# Patient Record
Sex: Male | Born: 1970 | Race: White | Hispanic: No | Smoking: Never smoker
Health system: Southern US, Community
[De-identification: ages and names within clinical notes are randomized; demographics above are authoritative.]

## PROBLEM LIST (undated history)

## (undated) DIAGNOSIS — I10 Essential (primary) hypertension: Secondary | ICD-10-CM

## (undated) DIAGNOSIS — D593 Hemolytic-uremic syndrome, unspecified: Secondary | ICD-10-CM

## (undated) DIAGNOSIS — M3119 Other thrombotic microangiopathy: Secondary | ICD-10-CM

## (undated) DIAGNOSIS — J309 Allergic rhinitis, unspecified: Secondary | ICD-10-CM

## (undated) DIAGNOSIS — L409 Psoriasis, unspecified: Secondary | ICD-10-CM

## (undated) DIAGNOSIS — M311 Thrombotic microangiopathy: Secondary | ICD-10-CM

## (undated) DIAGNOSIS — K5792 Diverticulitis of intestine, part unspecified, without perforation or abscess without bleeding: Secondary | ICD-10-CM

## (undated) DIAGNOSIS — R945 Abnormal results of liver function studies: Secondary | ICD-10-CM

## (undated) DIAGNOSIS — K635 Polyp of colon: Secondary | ICD-10-CM

## (undated) DIAGNOSIS — E669 Obesity, unspecified: Secondary | ICD-10-CM

## (undated) DIAGNOSIS — D126 Benign neoplasm of colon, unspecified: Secondary | ICD-10-CM

## (undated) DIAGNOSIS — R7989 Other specified abnormal findings of blood chemistry: Secondary | ICD-10-CM

## (undated) HISTORY — DX: Obesity, unspecified: E66.9

## (undated) HISTORY — PX: COLONOSCOPY: SHX174

## (undated) HISTORY — DX: Polyp of colon: K63.5

## (undated) HISTORY — PX: SIGMOIDOSCOPY: SUR1295

## (undated) HISTORY — DX: Allergic rhinitis, unspecified: J30.9

## (undated) HISTORY — DX: Benign neoplasm of colon, unspecified: D12.6

## (undated) HISTORY — DX: Hemolytic-uremic syndrome: D59.3

## (undated) HISTORY — DX: Essential (primary) hypertension: I10

## (undated) HISTORY — PX: WISDOM TOOTH EXTRACTION: SHX21

## (undated) HISTORY — DX: Other specified abnormal findings of blood chemistry: R79.89

## (undated) HISTORY — DX: Psoriasis, unspecified: L40.9

## (undated) HISTORY — DX: Hemolytic-uremic syndrome, unspecified: D59.30

## (undated) HISTORY — DX: Abnormal results of liver function studies: R94.5

## (undated) HISTORY — DX: Diverticulitis of intestine, part unspecified, without perforation or abscess without bleeding: K57.92

---

## 2007-01-30 ENCOUNTER — Ambulatory Visit: Payer: Self-pay | Admitting: Internal Medicine

## 2007-05-31 ENCOUNTER — Other Ambulatory Visit: Payer: Self-pay

## 2007-05-31 ENCOUNTER — Emergency Department: Payer: Self-pay | Admitting: Emergency Medicine

## 2007-08-31 ENCOUNTER — Ambulatory Visit: Payer: Self-pay | Admitting: Family Medicine

## 2009-06-05 ENCOUNTER — Ambulatory Visit: Payer: Self-pay | Admitting: Internal Medicine

## 2009-08-14 ENCOUNTER — Emergency Department: Payer: Self-pay | Admitting: Emergency Medicine

## 2010-03-20 ENCOUNTER — Emergency Department: Payer: Self-pay | Admitting: Emergency Medicine

## 2010-04-12 ENCOUNTER — Ambulatory Visit: Payer: Self-pay | Admitting: Family Medicine

## 2010-04-12 IMAGING — CT CT ABDOMEN AND PELVIS WITHOUT AND WITH CONTRAST
2 of 4 series · 13 of 32 positions shown, 18 images · non-contrast
Comparison: none

REASON FOR EXAM: lower abd pain blood in stool and urine
COMMENTS:

[Series 2: abd wo 5.0 i40f · axial · 0.91mm/px · z∈[-1168,-778]mm · 8 of 102 slices shown, 13 images]
[im 12/102  soft-tissue]
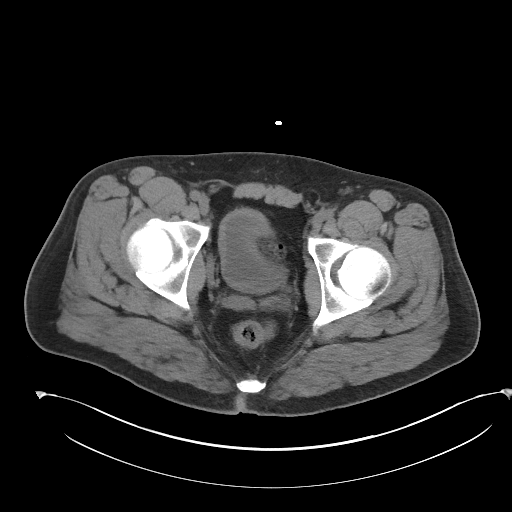
[im 12/102  bone]
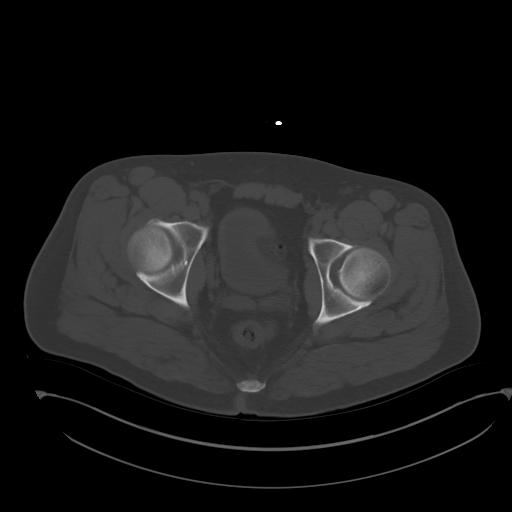
[im 23/102  soft-tissue]
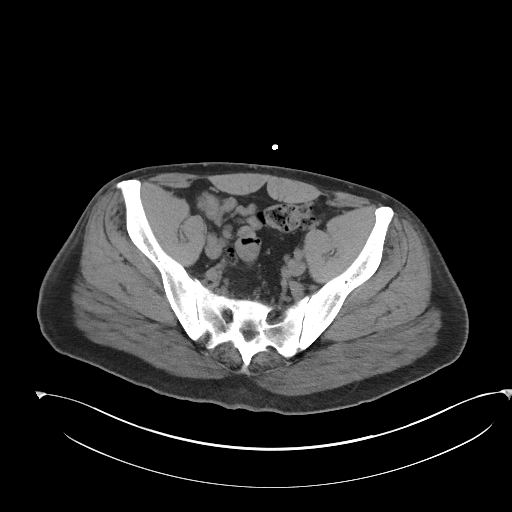
[im 34/102  soft-tissue]
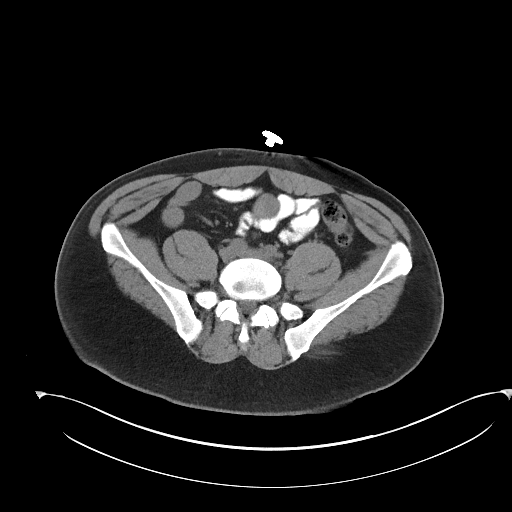
[im 45/102  soft-tissue]
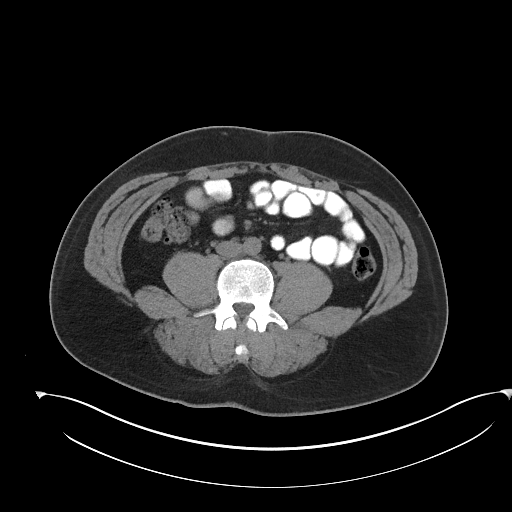
[im 57/102  soft-tissue]
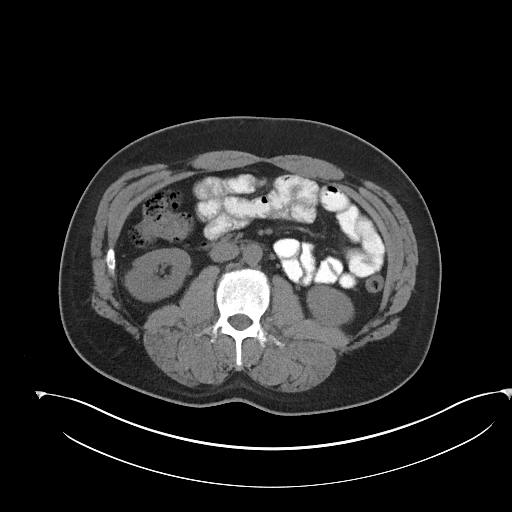
[im 57/102  lung]
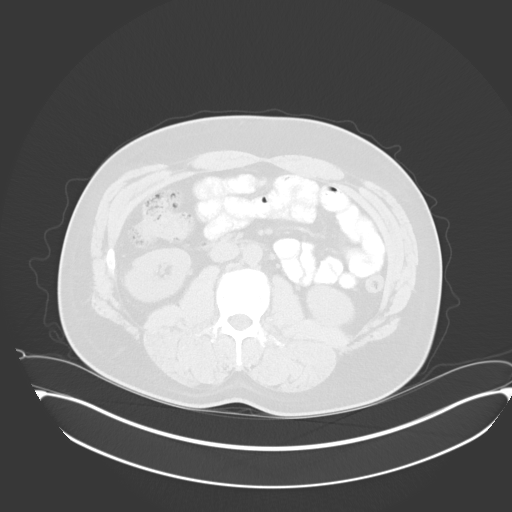
[im 68/102  soft-tissue]
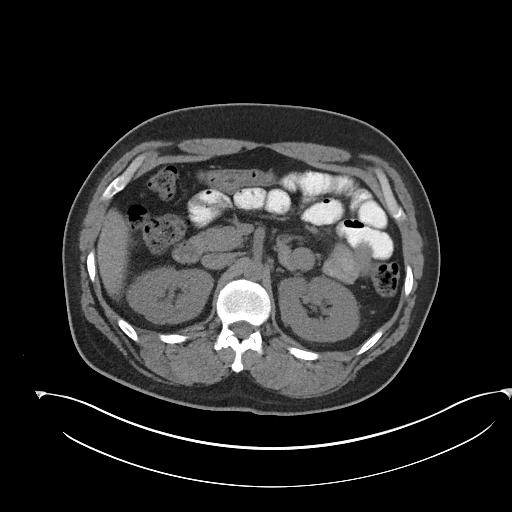
[im 68/102  lung]
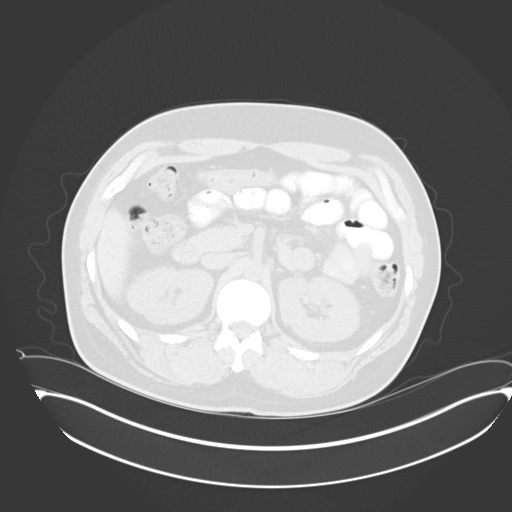
[im 79/102  soft-tissue]
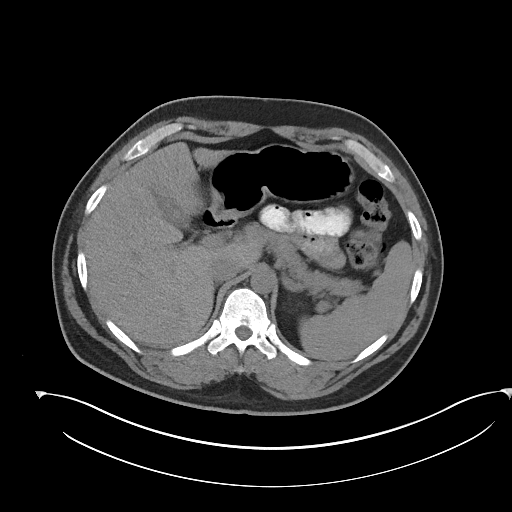
[im 79/102  lung]
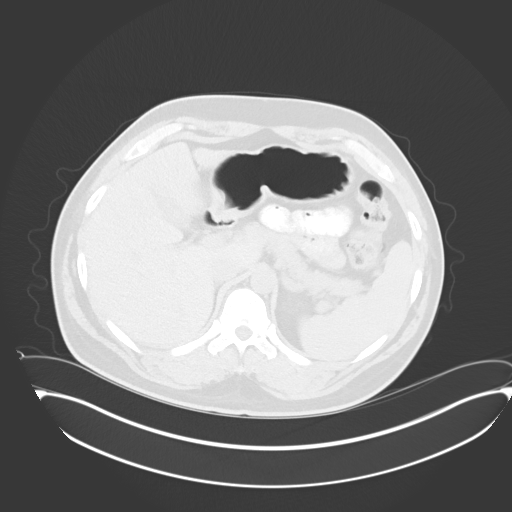
[im 90/102  soft-tissue]
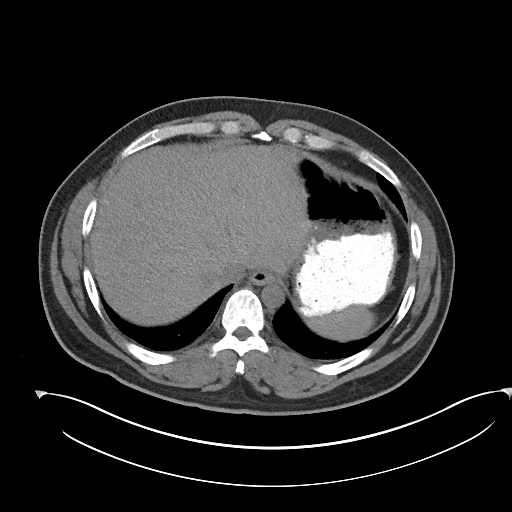
[im 90/102  lung]
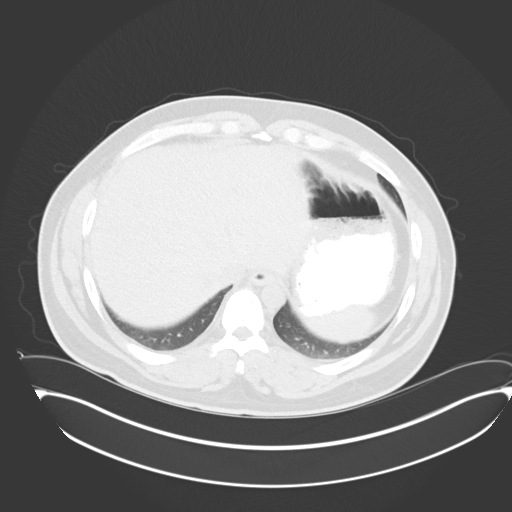

[Series 4: abd with 5.0 i40f · axial · 0.91mm/px · z∈[-1168,-943]mm · 5 of 102 slices shown]
[im 12/102  soft-tissue]
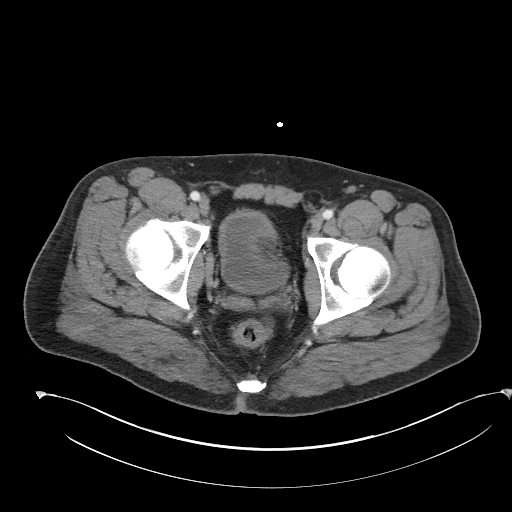
[im 23/102  soft-tissue]
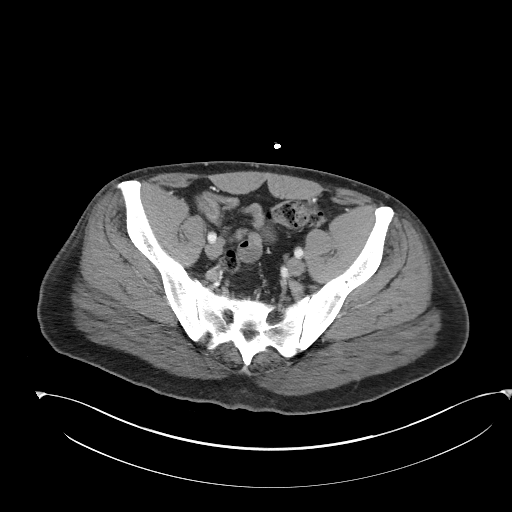
[im 34/102  soft-tissue]
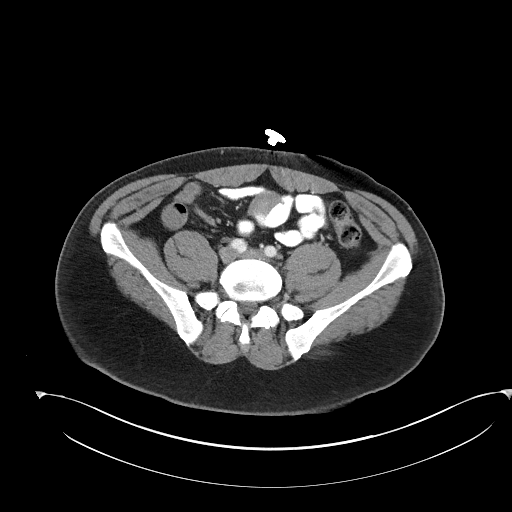
[im 45/102  soft-tissue]
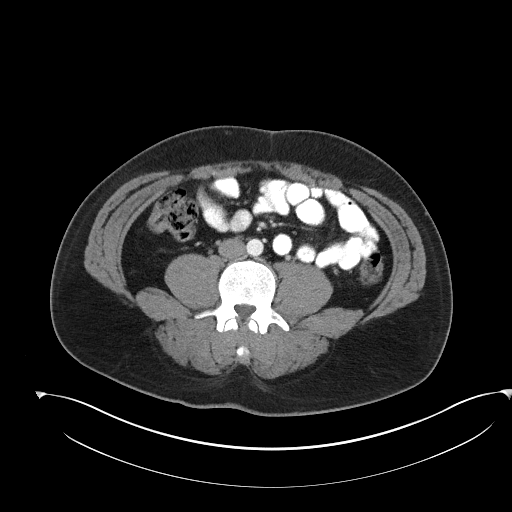
[im 57/102  soft-tissue]
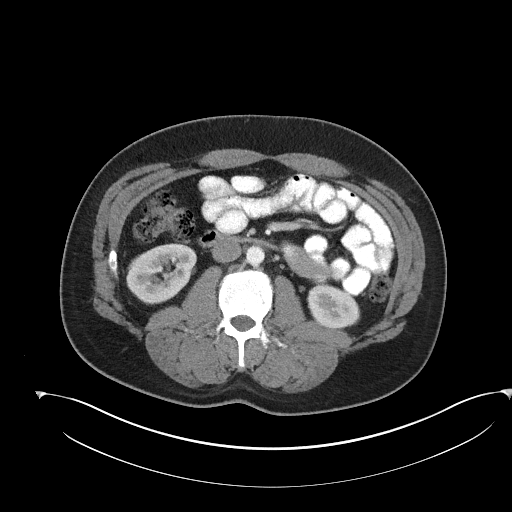

[13 of 32 positions shown; findings below may reference images not displayed]

PROCEDURE:     KCT - KCT ABDOMEN/PELVIS W/WO  - [DATE]  [DATE]

RESULT:     Axial CT scanning was performed through the abdomen and pelvis
at 5 mm intervals and slice thicknesses using a triphasic technique. The
patient received oral contrast. He received IV contrast in the form of 100
mL of [RR] for the postcontrast studies. Review of multiplanar
reconstructed images was performed separately on the VIA monitor.

The liver exhibits normal density with no focal mass nor ductal dilation.
The gallbladder is adequately distended with no evidence of calcified
stones. The spleen is normal in density and contour it does not appear
abnormally enlarged. It measures 11.3 cm longitudinally by 10.6 cm
transversely by approximately 11.9 cm AP. The pancreas, nondistended
correction partially distended stomach, adrenal glands, and kidneys are
normal in appearance. The caliber of the abdominal aorta is normal. The
partially contrast-filled loops of small and large bowel also appear normal.
Within the pelvis the partially distended urinary bladder is normal in
appearance. The prostate gland is mildly enlarged. There is no free fluid in
the pelvis.

On the noncontrast images the kidneys exhibit no calcified stones or
obstruction. The perinephric fat is normal in density. On delayed images
contrast within the renal collecting systems and ureters is within the
limits of normal. The lung bases are clear. The lumbar vertebral bodies are
preserved in height.
IMPRESSION: 1. I do not see evidence of an intra-abdominal or pelvic mass or hematoma.
2. The spleen does not appear abnormally enlarged.
3. There is no intra-abdominal or pelvic lymphadenopathy.
4. I do not see evidence of acute urinary tract abnormality. There is mild
prominence of the prostate gland.
5. I do not see evidence of acute hepatobiliary abnormality nor acute bowel
abnormality. Of note is the fact that the contrast has not yet reached the
colon. A normal appearing appendix is demonstrated.

## 2010-04-14 ENCOUNTER — Ambulatory Visit: Payer: Self-pay | Admitting: Internal Medicine

## 2010-04-18 ENCOUNTER — Ambulatory Visit: Payer: Self-pay | Admitting: Internal Medicine

## 2010-05-09 ENCOUNTER — Ambulatory Visit: Payer: Self-pay | Admitting: Gastroenterology

## 2010-05-10 LAB — PATHOLOGY REPORT

## 2010-05-15 ENCOUNTER — Ambulatory Visit: Payer: Self-pay | Admitting: Internal Medicine

## 2010-06-20 ENCOUNTER — Ambulatory Visit: Payer: Self-pay | Admitting: Internal Medicine

## 2010-07-14 ENCOUNTER — Ambulatory Visit: Payer: Self-pay | Admitting: Internal Medicine

## 2011-10-01 ENCOUNTER — Emergency Department: Payer: Self-pay | Admitting: *Deleted

## 2011-10-01 LAB — CBC
HCT: 44.2 % (ref 40.0–52.0)
MCHC: 35 g/dL (ref 32.0–36.0)
MCV: 95 fL (ref 80–100)
RBC: 4.64 10*6/uL (ref 4.40–5.90)
WBC: 10.9 10*3/uL — ABNORMAL HIGH (ref 3.8–10.6)

## 2011-10-01 LAB — COMPREHENSIVE METABOLIC PANEL
Albumin: 4.1 g/dL (ref 3.4–5.0)
Alkaline Phosphatase: 81 U/L (ref 50–136)
Anion Gap: 10 (ref 7–16)
BUN: 12 mg/dL (ref 7–18)
Bilirubin,Total: 0.6 mg/dL (ref 0.2–1.0)
Calcium, Total: 9.4 mg/dL (ref 8.5–10.1)
Chloride: 106 mmol/L (ref 98–107)
Co2: 24 mmol/L (ref 21–32)
EGFR (African American): >60
EGFR (Non-African Amer.): >60
Glucose: 99 mg/dL (ref 65–99)
Osmolality: 279 (ref 275–301)
Potassium: 3.9 mmol/L (ref 3.5–5.1)
SGPT (ALT): 33 U/L

## 2014-05-24 ENCOUNTER — Ambulatory Visit: Payer: Self-pay | Admitting: Gastroenterology

## 2014-05-24 LAB — HM COLONOSCOPY

## 2014-08-07 LAB — SURGICAL PATHOLOGY

## 2014-09-14 DIAGNOSIS — R7989 Other specified abnormal findings of blood chemistry: Secondary | ICD-10-CM | POA: Insufficient documentation

## 2014-09-14 DIAGNOSIS — R945 Abnormal results of liver function studies: Secondary | ICD-10-CM | POA: Insufficient documentation

## 2015-06-01 ENCOUNTER — Encounter: Payer: Self-pay | Admitting: Emergency Medicine

## 2015-06-01 ENCOUNTER — Emergency Department
Admission: EM | Admit: 2015-06-01 | Discharge: 2015-06-01 | Disposition: A | Discharge status: Home / Self Care | Payer: Worker's Compensation | Attending: Emergency Medicine | Admitting: Emergency Medicine

## 2015-06-01 DIAGNOSIS — Y9289 Other specified places as the place of occurrence of the external cause: Secondary | ICD-10-CM | POA: Diagnosis not present

## 2015-06-01 DIAGNOSIS — Y990 Civilian activity done for income or pay: Secondary | ICD-10-CM | POA: Insufficient documentation

## 2015-06-01 DIAGNOSIS — W228XXA Striking against or struck by other objects, initial encounter: Secondary | ICD-10-CM | POA: Insufficient documentation

## 2015-06-01 DIAGNOSIS — S0101XA Laceration without foreign body of scalp, initial encounter: Secondary | ICD-10-CM | POA: Insufficient documentation

## 2015-06-01 DIAGNOSIS — Z23 Encounter for immunization: Secondary | ICD-10-CM | POA: Diagnosis not present

## 2015-06-01 DIAGNOSIS — Y9389 Activity, other specified: Secondary | ICD-10-CM | POA: Insufficient documentation

## 2015-06-01 HISTORY — DX: Other thrombotic microangiopathy: M31.19

## 2015-06-01 HISTORY — DX: Thrombotic microangiopathy: M31.1

## 2015-06-01 MED ORDER — TETANUS-DIPHTH-ACELL PERTUSSIS 5-2.5-18.5 LF-MCG/0.5 IM SUSP
0.5000 mL | Freq: Once | INTRAMUSCULAR | Status: AC
  Administered 2015-06-01: 0.5 mL via INTRAMUSCULAR
  Filled 2015-06-01: qty 0.5

## 2015-06-01 NOTE — ED Notes (Signed)
Patient's employer [employer] [phone]) has requested a urine drug screen.

## 2015-06-01 NOTE — Discharge Instructions (Signed)
Keep the wound clean and dry for the next 24 hours, then you may shower. Do not put any type of antibiotic ointment or lotions on the laceration until the glue has worn off. He may then use triple antibiotic ointment.  Laceration Care, Adult A laceration is a cut that goes through all layers of the skin. The cut also goes into the tissue that is right under the skin. Some cuts heal on their own. Others need to be closed with stitches (sutures), staples, skin adhesive strips, or wound glue. Taking care of your cut lowers your risk of infection and helps your cut to heal better. HOW TO TAKE CARE OF YOUR CUT For stitches or staples:  Keep the wound clean and dry.  If you were given a bandage (dressing), you should change it at least one time per day or as told by your doctor. You should also change it if it gets wet or dirty.  Keep the wound completely dry for the first 24 hours or as told by your doctor. After that time, you may take a shower or a bath. However, make sure that the wound is not soaked in water until after the stitches or staples have been removed.  Clean the wound one time each day or as told by your doctor:  Wash the wound with soap and water.  Rinse the wound with water until all of the soap comes off.  Pat the wound dry with a clean towel. Do not rub the wound.  After you clean the wound, put a thin layer of antibiotic ointment on it as told by your doctor. This ointment:  Helps to prevent infection.  Keeps the bandage from sticking to the wound.  Have your stitches or staples removed as told by your doctor. If your doctor used skin adhesive strips:   Keep the wound clean and dry.  If you were given a bandage, you should change it at least one time per day or as told by your doctor. You should also change it if it gets dirty or wet.  Do not get the skin adhesive strips wet. You can take a shower or a bath, but be careful to keep the wound dry.  If the wound gets  wet, pat it dry with a clean towel. Do not rub the wound.  Skin adhesive strips fall off on their own. You can trim the strips as the wound heals. Do not remove any strips that are still stuck to the wound. They will fall off after a while. If your doctor used wound glue:  Try to keep your wound dry, but you may briefly wet it in the shower or bath. Do not soak the wound in water, such as by swimming.  After you take a shower or a bath, gently pat the wound dry with a clean towel. Do not rub the wound.  Do not do any activities that will make you really sweaty until the skin glue has fallen off on its own.  Do not apply liquid, cream, or ointment medicine to your wound while the skin glue is still on.  If you were given a bandage, you should change it at least one time per day or as told by your doctor. You should also change it if it gets dirty or wet.  If a bandage is placed over the wound, do not let the tape for the bandage touch the skin glue.  Do not pick at the glue. The skin  glue usually stays on for 5-10 days. Then, it falls off of the skin. General Instructions  To help prevent scarring, make sure to cover your wound with sunscreen whenever you are outside after stitches are removed, after adhesive strips are removed, or when wound glue stays in place and the wound is healed. Make sure to wear a sunscreen of at least 30 SPF.  Take over-the-counter and prescription medicines only as told by your doctor.  If you were given antibiotic medicine or ointment, take or apply it as told by your doctor. Do not stop using the antibiotic even if your wound is getting better.  Do not scratch or pick at the wound.  Keep all follow-up visits as told by your doctor. This is important.  Check your wound every day for signs of infection. Watch for:  Redness, swelling, or pain.  Fluid, blood, or pus.  Raise (elevate) the injured area above the level of your heart while you are sitting or  lying down, if possible. GET HELP IF:  You got a tetanus shot and you have any of these problems at the injection site:  Swelling.  Very bad pain.  Redness.  Bleeding.  You have a fever.  A wound that was closed breaks open.  You notice a bad smell coming from your wound or your bandage.  You notice something coming out of the wound, such as wood or glass.  Medicine does not help your pain.  You have more redness, swelling, or pain at the site of your wound.  You have fluid, blood, or pus coming from your wound.  You notice a change in the color of your skin near your wound.  You need to change the bandage often because fluid, blood, or pus is coming from the wound.  You start to have a new rash.  You start to have numbness around the wound. GET HELP RIGHT AWAY IF:  You have very bad swelling around the wound.  Your pain suddenly gets worse and is very bad.  You notice painful lumps near the wound or on skin that is anywhere on your body.  You have a red streak going away from your wound.  The wound is on your hand or foot and you cannot move a finger or toe like you usually can.  The wound is on your hand or foot and you notice that your fingers or toes look pale or bluish.   This information is not intended to replace advice given to you by your health care provider. Make sure you discuss any questions you have with your health care provider.   Document Released: 09/17/2007 Document Revised: 08/15/2014 Document Reviewed: 03/27/2014 Elsevier Interactive Patient Education [employer].

## 2015-06-01 NOTE — ED Provider Notes (Signed)
[facility] Emergency Department Provider Note ____________________________________________  Time seen: Approximately 11:12 PM  I have reviewed the triage vital signs and the nursing notes.   HISTORY  Chief Complaint Head Laceration   HPI Ricky Mejia is a 45 y.o. male who presents to the emergency department for evaluation of the laceration. He states that while at work, he went to stand up and struck his head on a metal shelf that caused the laceration. He denies loss of consciousness. He denies falling to the ground.   Past Medical History  Diagnosis Date  . T.T.P. syndrome ([place])     There are no active problems to display for this patient.   History reviewed. No pertinent past surgical history.  No current outpatient prescriptions on file.  Allergies Sulfa antibiotics  History reviewed. No pertinent family history.  Social History Social History  Substance Use Topics  . Smoking status: Never Smoker   . Smokeless tobacco: Current User  . Alcohol Use: No    Review of Systems   Constitutional: No recent illness Eyes: No visual changes. ENT: No epistaxis Respiratory: Denies shortness of breath. Musculoskeletal: No extremity injury. Skin: Positive for laceration to the right scalp Neurological: Negative for headaches, focal weakness or numbness.   ____________________________________________   PHYSICAL EXAM:  VITAL SIGNS: ED Triage Vitals  Enc Vitals Group     BP 06/01/15 2120 146/82 mmHg     Pulse Rate 06/01/15 2120 90     Resp 06/01/15 2120 14     Temp 06/01/15 2120 98.8 F (37.1 C)     Temp src --      SpO2 06/01/15 2120 100 %     Weight 06/01/15 2120 240 lb (108.863 kg)     Height 06/01/15 2120 6' 1"  (1.854 m)     Head Cir --      Peak Flow --      Pain Score 06/01/15 2120 2     Pain Loc --      Pain Edu? --      Excl. in [place]? --     Constitutional: Alert and oriented. Well appearing and in no acute  distress. Eyes: Conjunctivae are normal. PERRL. EOMI. Head: Laceration to the right scalp, frontal aspect Mouth/Throat: Mucous membranes are moist.  Oropharynx non-erythematous. No oral lesions or lacerations. Neck: No stridor. Respiratory: Normal respiratory effort.  No retractions. Musculoskeletal: Active range of motion in all extremities. Neurologic:  Normal speech and language. No gross focal neurologic deficits are appreciated. Speech is normal. No gait instability. Skin:  2 cm flap laceration noted to the right side of the scalp, just inside of the hairline;  ____________________________________________   LABS (all labs ordered are listed, but only abnormal results are displayed)  Labs Reviewed - No data to display ____________________________________________  EKG   ____________________________________________  RADIOLOGY  Not indicated ____________________________________________   PROCEDURES  Procedure(s) performed:   Laceration was cleaned with surgical cleanse and irrigated with normal saline. Bleeding was very well controlled. Skin adhesive was used to seal the edges of the wound. Patient tolerated the procedure well and there were no immediate complications. ____________________________________________   INITIAL IMPRESSION / ASSESSMENT AND PLAN / ED COURSE  Pertinent labs & imaging results that were available during my care of the patient were reviewed by me and considered in my medical decision making (see chart for details).  Patient was given wound care instructions and advised to follow-up with primary care provider for any symptoms of  concern. He was advised to return to emergency department for symptoms that change or worsen if he is unable schedule an appointment. ____________________________________________   FINAL CLINICAL IMPRESSION(S) / ED DIAGNOSES  Final diagnoses:  Laceration of scalp without complication, initial encounter       Victorino Dike, FNP 06/01/15 2318  Lavonia Drafts, MD 06/01/15 [phone]

## 2015-06-01 NOTE — ED Notes (Signed)
Pt states lacerated head at work at 2030. Pt with one inch laceration/abrasion with controlled bleeding to right scalp. Pt states struck head on shelf, denies loc.

## 2015-06-01 NOTE — ED Notes (Signed)
Workers comp completed

## 2015-06-04 ENCOUNTER — Emergency Department
Admission: EM | Admit: 2015-06-04 | Discharge: 2015-06-04 | Disposition: A | Discharge status: Home / Self Care | Payer: Worker's Compensation | Attending: Emergency Medicine | Admitting: Emergency Medicine

## 2015-06-04 ENCOUNTER — Encounter: Payer: Self-pay | Admitting: Emergency Medicine

## 2015-06-04 ENCOUNTER — Emergency Department: Payer: Worker's Compensation

## 2015-06-04 DIAGNOSIS — W2209XA Striking against other stationary object, initial encounter: Secondary | ICD-10-CM | POA: Diagnosis not present

## 2015-06-04 DIAGNOSIS — S060X0A Concussion without loss of consciousness, initial encounter: Secondary | ICD-10-CM | POA: Insufficient documentation

## 2015-06-04 DIAGNOSIS — S0990XA Unspecified injury of head, initial encounter: Secondary | ICD-10-CM | POA: Diagnosis present

## 2015-06-04 DIAGNOSIS — Y9389 Activity, other specified: Secondary | ICD-10-CM | POA: Diagnosis not present

## 2015-06-04 DIAGNOSIS — Y998 Other external cause status: Secondary | ICD-10-CM | POA: Insufficient documentation

## 2015-06-04 DIAGNOSIS — Y9289 Other specified places as the place of occurrence of the external cause: Secondary | ICD-10-CM | POA: Insufficient documentation

## 2015-06-04 MED ORDER — ONDANSETRON HCL 4 MG PO TABS: 4.0000 mg | ORAL_TABLET | Freq: Three times a day (TID) | ORAL | Status: DC | PRN

## 2015-06-04 MED ORDER — ONDANSETRON 4 MG PO TBDP
4.0000 mg | ORAL_TABLET | Freq: Once | ORAL | Status: AC
  Administered 2015-06-04: 4 mg via ORAL
  Filled 2015-06-04: qty 1
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
## 2015-06-04 NOTE — Discharge Instructions (Signed)
Concussion, Adult A concussion, or closed-head injury, is a brain injury caused by a direct blow to the head or by a quick and sudden movement (jolt) of the head or neck. Concussions are usually not life-threatening. Even so, the effects of a concussion can be serious. If you have had a concussion before, you are more likely to experience concussion-like symptoms after a direct blow to the head.  CAUSES  Direct blow to the head, such as from running into another player during a soccer game, being hit in a fight, or hitting your head on a hard surface.  A jolt of the head or neck that causes the brain to move back and forth inside the skull, such as in a car crash. SIGNS AND SYMPTOMS The signs of a concussion can be hard to notice. Early on, they may be missed by you, family members, and health care providers. You may look fine but act or feel differently. Symptoms are usually temporary, but they may last for days, weeks, or even longer. Some symptoms may appear right away while others may not show up for hours or days. Every head injury is different. Symptoms include:  Mild to moderate headaches that will not go away.  A feeling of pressure inside your head.  Having more trouble than usual:  Learning or remembering things you have heard.  Answering questions.  Paying attention or concentrating.  Organizing daily tasks.  Making decisions and solving problems.  Slowness in thinking, acting or reacting, speaking, or reading.  Getting lost or being easily confused.  Feeling tired all the time or lacking energy (fatigued).  Feeling drowsy.  Sleep disturbances.  Sleeping more than usual.  Sleeping less than usual.  Trouble falling asleep.  Trouble sleeping (insomnia).  Loss of balance or feeling lightheaded or dizzy.  Nausea or vomiting.  Numbness or tingling.  Increased sensitivity to:  Sounds.  Lights.  Distractions.  Vision problems or eyes that tire  easily.  Diminished sense of taste or smell.  Ringing in the ears.  Mood changes such as feeling sad or anxious.  Becoming easily irritated or angry for little or no reason.  Lack of motivation.  Seeing or hearing things other people do not see or hear (hallucinations). DIAGNOSIS Your health care provider can usually diagnose a concussion based on a description of your injury and symptoms. He or she will ask whether you passed out (lost consciousness) and whether you are having trouble remembering events that happened right before and during your injury. Your evaluation might include:  A brain scan to look for signs of injury to the brain. Even if the test shows no injury, you may still have a concussion.  Blood tests to be sure other problems are not present. TREATMENT  Concussions are usually treated in an emergency department, in urgent care, or at a clinic. You may need to stay in the hospital overnight for further treatment.  Tell your health care provider if you are taking any medicines, including prescription medicines, over-the-counter medicines, and natural remedies. Some medicines, such as blood thinners (anticoagulants) and aspirin, may increase the chance of complications. Also tell your health care provider whether you have had alcohol or are taking illegal drugs. This information may affect treatment.  Your health care provider will send you home with important instructions to follow.  How fast you will recover from a concussion depends on many factors. These factors include how severe your concussion is, what part of your brain was injured,  your age, and how healthy you were before the concussion.  Most people with mild injuries recover fully. Recovery can take time. In general, recovery is slower in older persons. Also, persons who have had a concussion in the past or have other medical problems may find that it takes longer to recover from their current injury. HOME  CARE INSTRUCTIONS General Instructions  Carefully follow the directions your health care provider gave you.  Only take over-the-counter or prescription medicines for pain, discomfort, or fever as directed by your health care provider.  Take only those medicines that your health care provider has approved.  Do not drink alcohol until your health care provider says you are well enough to do so. Alcohol and certain other drugs may slow your recovery and can put you at risk of further injury.  If it is harder than usual to remember things, write them down.  If you are easily distracted, try to do one thing at a time. For example, do not try to watch TV while fixing dinner.  Talk with family members or close friends when making important decisions.  Keep all follow-up appointments. Repeated evaluation of your symptoms is recommended for your recovery.  Watch your symptoms and tell others to do the same. Complications sometimes occur after a concussion. Older adults with a brain injury may have a higher risk of serious complications, such as a blood clot on the brain.  Tell your teachers, school nurse, school counselor, coach, athletic trainer, or work Freight forwarder about your injury, symptoms, and restrictions. Tell them about what you can or cannot do. They should watch for:  Increased problems with attention or concentration.  Increased difficulty remembering or learning new information.  Increased time needed to complete tasks or assignments.  Increased irritability or decreased ability to cope with stress.  Increased symptoms.  Rest. Rest helps the brain to heal. Make sure you:  Get plenty of sleep at night. Avoid staying up late at night.  Keep the same bedtime hours on weekends and weekdays.  Rest during the day. Take daytime naps or rest breaks when you feel tired.  Limit activities that require a lot of thought or concentration. These include:  Doing homework or job-related  work.  Watching TV.  Working on the computer.  Avoid any situation where there is potential for another head injury (football, hockey, soccer, basketball, martial arts, downhill snow sports and horseback riding). Your condition will get worse every time you experience a concussion. You should avoid these activities until you are evaluated by the appropriate follow-up health care providers. Returning To Your Regular Activities You will need to return to your normal activities slowly, not all at once. You must give your body and brain enough time for recovery.  Do not return to sports or other athletic activities until your health care provider tells you it is safe to do so.  Ask your health care provider when you can drive, ride a bicycle, or operate heavy machinery. Your ability to react may be slower after a brain injury. Never do these activities if you are dizzy.  Ask your health care provider about when you can return to work or school. Preventing Another Concussion It is very important to avoid another brain injury, especially before you have recovered. In rare cases, another injury can lead to permanent brain damage, brain swelling, or death. The risk of this is greatest during the first 7-10 days after a head injury. Avoid injuries by:  Wearing a  seat belt when riding in a car.  Drinking alcohol only in moderation.  Wearing a helmet when biking, skiing, skateboarding, skating, or doing similar activities.  Avoiding activities that could lead to a second concussion, such as contact or recreational sports, until your health care provider says it is okay.  Taking safety measures in your home.  Remove clutter and tripping hazards from floors and stairways.  Use grab bars in bathrooms and handrails by stairs.  Place non-slip mats on floors and in bathtubs.  Improve lighting in dim areas. SEEK MEDICAL CARE IF:  You have increased problems paying attention or  concentrating.  You have increased difficulty remembering or learning new information.  You need more time to complete tasks or assignments than before.  You have increased irritability or decreased ability to cope with stress.  You have more symptoms than before. Seek medical care if you have any of the following symptoms for more than 2 weeks after your injury:  Lasting (chronic) headaches.  Dizziness or balance problems.  Nausea.  Vision problems.  Increased sensitivity to noise or light.  Depression or mood swings.  Anxiety or irritability.  Memory problems.  Difficulty concentrating or paying attention.  Sleep problems.  Feeling tired all the time. SEEK IMMEDIATE MEDICAL CARE IF:  You have severe or worsening headaches. These may be a sign of a blood clot in the brain.  You have weakness (even if only in one hand, leg, or part of the face).  You have numbness.  You have decreased coordination.  You vomit repeatedly.  You have increased sleepiness.  One pupil is larger than the other.  You have convulsions.  You have slurred speech.  You have increased confusion. This may be a sign of a blood clot in the brain.  You have increased restlessness, agitation, or irritability.  You are unable to recognize people or places.  You have neck pain.  It is difficult to wake you up.  You have unusual behavior changes.  You lose consciousness. MAKE SURE YOU:  Understand these instructions.  Will watch your condition.  Will get help right away if you are not doing well or get worse.   This information is not intended to replace advice given to you by your health care provider. Make sure you discuss any questions you have with your health care provider.   Document Released: 06/21/2003 Document Revised: 04/21/2014 Document Reviewed: 10/21/2012 Elsevier Interactive Patient Education [employer].  Please return immediately if condition worsens.  Please contact her primary physician or the physician you were given for referral. If you have any specialist physicians involved in her treatment and plan please also contact them. Thank you for using [facility].

## 2015-06-04 NOTE — ED Notes (Signed)
Has been vomiting since head injury Friday night in [facility].Was seen here after and they fixed his head.  Says there was no loc in [facility].

## 2015-12-11 ENCOUNTER — Encounter: Payer: Self-pay | Admitting: Emergency Medicine

## 2015-12-11 ENCOUNTER — Emergency Department
Admission: EM | Admit: 2015-12-11 | Discharge: 2015-12-11 | Disposition: A | Discharge status: Home / Self Care | Payer: 59 | Attending: Emergency Medicine | Admitting: Emergency Medicine

## 2015-12-11 ENCOUNTER — Emergency Department: Payer: 59

## 2015-12-11 DIAGNOSIS — Z79899 Other long term (current) drug therapy: Secondary | ICD-10-CM | POA: Insufficient documentation

## 2015-12-11 DIAGNOSIS — R109 Unspecified abdominal pain: Secondary | ICD-10-CM | POA: Diagnosis present

## 2015-12-11 DIAGNOSIS — R1033 Periumbilical pain: Secondary | ICD-10-CM | POA: Insufficient documentation

## 2015-12-11 DIAGNOSIS — F17200 Nicotine dependence, unspecified, uncomplicated: Secondary | ICD-10-CM | POA: Diagnosis not present

## 2015-12-11 DIAGNOSIS — R112 Nausea with vomiting, unspecified: Secondary | ICD-10-CM | POA: Diagnosis not present

## 2015-12-11 LAB — URINALYSIS COMPLETE WITH MICROSCOPIC (ARMC ONLY)
BACTERIA UA: NONE SEEN
Bilirubin Urine: NEGATIVE
Glucose, UA: NEGATIVE mg/dL
Hgb urine dipstick: NEGATIVE
KETONES UR: NEGATIVE mg/dL
Leukocytes, UA: NEGATIVE
Nitrite: NEGATIVE
PH: 7 (ref 5.0–8.0)
PROTEIN: NEGATIVE mg/dL
SQUAMOUS EPITHELIAL / LPF: NONE SEEN
Specific Gravity, Urine: 1.019 (ref 1.005–1.030)

## 2015-12-11 LAB — CBC
HEMATOCRIT: 44.6 % (ref 40.0–52.0)
Hemoglobin: 16.2 g/dL (ref 13.0–18.0)
MCH: 33.8 pg (ref 26.0–34.0)
MCHC: 36.2 g/dL — ABNORMAL HIGH (ref 32.0–36.0)
MCV: 93.4 fL (ref 80.0–100.0)
PLATELETS: 187 10*3/uL (ref 150–440)
RBC: 4.78 MIL/uL (ref 4.40–5.90)
RDW: 13.1 % (ref 11.5–14.5)
WBC: 8.4 10*3/uL (ref 3.8–10.6)

## 2015-12-11 LAB — COMPREHENSIVE METABOLIC PANEL
ALK PHOS: 67 U/L (ref 38–126)
ALT: 84 U/L — AB (ref 17–63)
AST: 76 U/L — AB (ref 15–41)
Albumin: 4.7 g/dL (ref 3.5–5.0)
Anion gap: 6 (ref 5–15)
BUN: 13 mg/dL (ref 6–20)
CALCIUM: 9.5 mg/dL (ref 8.9–10.3)
CHLORIDE: 104 mmol/L (ref 101–111)
CO2: 26 mmol/L (ref 22–32)
CREATININE: 1.06 mg/dL (ref 0.61–1.24)
GFR calc Af Amer: >60 mL/min (ref >60)
GFR calc non Af Amer: >60 mL/min (ref >60)
GLUCOSE: 102 mg/dL — AB (ref 65–99)
Potassium: 3.9 mmol/L (ref 3.5–5.1)
Sodium: 136 mmol/L (ref 135–145)
Total Bilirubin: 0.9 mg/dL (ref 0.3–1.2)
Total Protein: 7.6 g/dL (ref 6.5–8.1)

## 2015-12-11 LAB — LIPASE, BLOOD: Lipase: 49 U/L (ref 11–51)

## 2015-12-11 LAB — TROPONIN I

## 2015-12-11 MED ORDER — IOPAMIDOL (ISOVUE-300) INJECTION 61%
100.0000 mL | Freq: Once | INTRAVENOUS | Status: AC | PRN
  Administered 2015-12-11: 100 mL via INTRAVENOUS

## 2015-12-11 MED ORDER — MORPHINE SULFATE (PF) 4 MG/ML IV SOLN
4.0000 mg | Freq: Once | INTRAVENOUS | Status: AC
  Administered 2015-12-11: 4 mg via INTRAVENOUS
  Filled 2015-12-11: qty 1

## 2015-12-11 MED ORDER — SODIUM CHLORIDE 0.9 % IV BOLUS (SEPSIS)
1000.0000 mL | Freq: Once | INTRAVENOUS | Status: AC
  Administered 2015-12-11: 1000 mL via INTRAVENOUS

## 2015-12-11 MED ORDER — ONDANSETRON HCL 4 MG/2ML IJ SOLN
4.0000 mg | Freq: Once | INTRAMUSCULAR | Status: AC
  Administered 2015-12-11: 4 mg via INTRAVENOUS
  Filled 2015-12-11: qty 2

## 2015-12-11 MED ORDER — DIATRIZOATE MEGLUMINE & SODIUM 66-10 % PO SOLN
15.0000 mL | Freq: Once | ORAL | Status: AC
  Administered 2015-12-11: 15 mL via ORAL

## 2015-12-11 MED ORDER — ONDANSETRON 4 MG PO TBDP: 4.0000 mg | ORAL_TABLET | Freq: Three times a day (TID) | ORAL | 0 refills | Status: DC | PRN

## 2015-12-11 NOTE — ED Notes (Signed)
AAOx3.  Skin warm and dry.  Ambulates with easy and steady gait.  NAD.  POsture upright and relaxed.

## 2015-12-11 NOTE — ED Provider Notes (Signed)
[facility] Emergency Department Provider Note   ____________________________________________   First MD Initiated Contact with Patient 12/11/15 [phone]     (approximate)  I have reviewed the triage vital signs and the nursing notes.   HISTORY  Chief Complaint Emesis and Abdominal Pain    HPI Ricky Mejia is a 45 y.o. male who presents to the ED from home with a chief complaint of abdominal pain, nausea and vomiting. Reports symptoms onset approximately 11 PM. Describes nonradiating, sharp periumbilical pain associated with several bouts of nausea and vomiting. Last bowel movement at noon which was normal for patient. Denies associated fever, chills, chest pain, shortness of breath, dysuria, testicular pain or swelling. Denies recent travel or trauma. Nothing makes his symptoms better or worse.   Past Medical History:  Diagnosis Date  . T.T.P. syndrome ([place])     There are no active problems to display for this patient.   History reviewed. No pertinent surgical history.  Prior to Admission medications   Medication Sig Start Date End Date Taking? Authorizing Provider  ondansetron (ZOFRAN) 4 MG tablet Take 1 tablet (4 mg total) by mouth every 8 (eight) hours as needed for nausea or vomiting. 06/04/15   Daymon Larsen, MD    Allergies Sulfa antibiotics  No family history on file.  Social History Social History  Substance Use Topics  . Smoking status: Never Smoker  . Smokeless tobacco: Current User  . Alcohol use No    Review of Systems  Constitutional: No fever/chills. Eyes: No visual changes. ENT: No sore throat. Cardiovascular: Denies chest pain. Respiratory: Denies shortness of breath. Gastrointestinal: Positive for abdominal pain, nausea and vomiting.  No diarrhea.  No constipation. Genitourinary: Negative for dysuria. Musculoskeletal: Negative for back pain. Skin: Negative for rash. Neurological: Negative for headaches, focal  weakness or numbness.  10-point ROS otherwise negative.  ____________________________________________   PHYSICAL EXAM:  VITAL SIGNS: ED Triage Vitals  Enc Vitals Group     BP 12/11/15 0304 (!) 148/94     Pulse Rate 12/11/15 0304 80     Resp 12/11/15 0304 18     Temp 12/11/15 0304 98 F (36.7 C)     Temp Source 12/11/15 0304 Oral     SpO2 12/11/15 0304 99 %     Weight 12/11/15 0301 230 lb (104.3 kg)     Height 12/11/15 0301 6' 1"  (1.854 m)     Head Circumference --      Peak Flow --      Pain Score 12/11/15 0300 5     Pain Loc --      Pain Edu? --      Excl. in [place]? --     Constitutional: Asleep, easily awakened for exam. Alert and oriented. Well appearing and in no acute distress. Eyes: Conjunctivae are normal. PERRL. EOMI. Head: Atraumatic. Nose: No congestion/rhinnorhea. Mouth/Throat: Mucous membranes are moist.  Oropharynx non-erythematous. Neck: No stridor.   Cardiovascular: Normal rate, regular rhythm. Grossly normal heart sounds.  Good peripheral circulation. Respiratory: Normal respiratory effort.  No retractions. Lungs CTAB. Gastrointestinal: Soft and mildly tender to palpation at umbilicus without rebound or guarding. No distention. No abdominal bruits. No CVA tenderness. Musculoskeletal: No lower extremity tenderness nor edema.  No joint effusions. Neurologic:  Normal speech and language. No gross focal neurologic deficits are appreciated. No gait instability. Skin:  Skin is warm, dry and intact. No rash noted. Psychiatric: Mood and affect are normal. Speech and behavior are normal.  ____________________________________________   LABS (all labs ordered are listed, but only abnormal results are displayed)  Labs Reviewed  CBC - Abnormal; Notable for the following:       Result Value   MCHC 36.2 (*)    All other components within normal limits  COMPREHENSIVE METABOLIC PANEL - Abnormal; Notable for the following:    Glucose, Bld 102 (*)    AST 76 (*)     ALT 84 (*)    All other components within normal limits  URINALYSIS COMPLETEWITH MICROSCOPIC ([facility] ONLY) - Abnormal; Notable for the following:    Color, Urine YELLOW (*)    APPearance CLEAR (*)    All other components within normal limits  LIPASE, BLOOD  TROPONIN I   ____________________________________________  EKG  ED ECG REPORT I, Stephanieann Popescu J, the attending physician, personally viewed and interpreted this ECG.   Date: 12/11/2015  EKG Time: 0308  Rate: 70  Rhythm: normal EKG, normal sinus rhythm  Axis: Normal  Intervals:none  ST&T Change: Nonspecific  ____________________________________________  RADIOLOGY  Pending ____________________________________________   PROCEDURES  Procedure(s) performed: None  Procedures  Critical Care performed: No  ____________________________________________   INITIAL IMPRESSION / ASSESSMENT AND PLAN / ED COURSE  Pertinent labs & imaging results that were available during my care of the patient were reviewed by me and considered in my medical decision making (see chart for details).  45 year old male with no past surgical history who presents with periumbilical pain associated with nausea and vomiting. Laboratory results are unremarkable. Will infuse IV fluids, administer IV analgesia and proceed with CT abdomen/pelvis to evaluate for intra-abdominal etiology.  Clinical Course  Comment By Time  Care transferred to Dr. Edd Fabian pending results of CT scan. Paulette Blanch, MD 08/29 0730     ____________________________________________   FINAL CLINICAL IMPRESSION(S) / ED DIAGNOSES  Final diagnoses:  Periumbilical abdominal pain  Non-intractable vomiting with nausea, vomiting of unspecified type      NEW MEDICATIONS STARTED DURING THIS VISIT:  New Prescriptions   No medications on file     Note:  This document was prepared using Dragon voice recognition software and may include unintentional dictation errors.    Paulette Blanch, MD 12/11/15 0730

## 2015-12-11 NOTE — ED Provider Notes (Signed)
-----------------------------------------   9:31 AM on 12/11/2015 -----------------------------------------  I assumed care of this patient from Dr. Beather Arbour at 7:30 AM pending results of the CT of the abdomen and pelvis which are negative for any acute intra-abdominal pelvic pathology. Patient reports improvement of his symptoms at this time, tolerating by mouth without vomiting. Discussed return precautions, need for close PCP follow-up and he is comfortable with discharge plan. DC home.  IMPRESSION: 1. No acute findings. 2. Hepatomegaly with extensive hepatic steatosis. 3. Minor degenerative changes noted along the visualized spine. 4. No other abnormalities.   Joanne Gavel, MD 12/11/15 [phone]

## 2015-12-11 NOTE — ED Triage Notes (Signed)
Patient ambulatory to triage with steady gait, without difficulty or distress noted; pt reports N/V and mid abd pain since 11pm

## 2016-07-06 DIAGNOSIS — J069 Acute upper respiratory infection, unspecified: Secondary | ICD-10-CM | POA: Diagnosis not present

## 2016-07-21 DIAGNOSIS — J019 Acute sinusitis, unspecified: Secondary | ICD-10-CM | POA: Diagnosis not present

## 2016-07-21 DIAGNOSIS — H66003 Acute suppurative otitis media without spontaneous rupture of ear drum, bilateral: Secondary | ICD-10-CM | POA: Diagnosis not present

## 2016-07-21 DIAGNOSIS — B9689 Other specified bacterial agents as the cause of diseases classified elsewhere: Secondary | ICD-10-CM | POA: Diagnosis not present

## 2016-08-31 DIAGNOSIS — R1084 Generalized abdominal pain: Secondary | ICD-10-CM | POA: Diagnosis not present

## 2017-05-02 DIAGNOSIS — R21 Rash and other nonspecific skin eruption: Secondary | ICD-10-CM | POA: Diagnosis not present

## 2017-07-22 ENCOUNTER — Encounter: Payer: Self-pay | Admitting: Family Medicine

## 2017-07-22 ENCOUNTER — Ambulatory Visit: Payer: BLUE CROSS/BLUE SHIELD | Admitting: Family Medicine

## 2017-07-22 VITALS — BP 127/85 | HR 76 | Temp 98.4°F | Ht 69.0 in | Wt 259.5 lb

## 2017-07-22 DIAGNOSIS — J309 Allergic rhinitis, unspecified: Secondary | ICD-10-CM

## 2017-07-22 DIAGNOSIS — R21 Rash and other nonspecific skin eruption: Secondary | ICD-10-CM

## 2017-07-22 DIAGNOSIS — Z7689 Persons encountering health services in other specified circumstances: Secondary | ICD-10-CM

## 2017-07-22 MED ORDER — TRIAMCINOLONE ACETONIDE 0.1 % EX CREA: TOPICAL_CREAM | Freq: Two times a day (BID) | CUTANEOUS | 3 refills | Status: DC | PRN

## 2017-07-22 MED ORDER — MONTELUKAST SODIUM 10 MG PO TABS: 10.0000 mg | ORAL_TABLET | Freq: Every day | ORAL | 11 refills | Status: DC

## 2017-07-22 MED ORDER — CLOTRIMAZOLE 1 % EX CREA: 1.0000 "application " | TOPICAL_CREAM | Freq: Two times a day (BID) | CUTANEOUS | 1 refills | Status: DC

## 2017-07-22 MED ORDER — LEVOCETIRIZINE DIHYDROCHLORIDE 5 MG PO TABS: 5.0000 mg | ORAL_TABLET | Freq: Every evening | ORAL | 11 refills | Status: DC

## 2017-07-22 NOTE — Progress Notes (Signed)
BP 127/85 (BP Location: Right Arm, Patient Position: Sitting, Cuff Size: Normal)   Pulse 76   Temp 98.4 F (36.9 C) (Oral)   Ht 5' 9"  (1.753 m)   Wt 259 lb 8 oz (117.7 kg)   SpO2 98%   BMI 38.32 kg/m    Subjective:    Patient ID: Ricky Mejia, male    DOB: 1970/08/28, 47 y.o.   MRN: [id number]  HPI: Ricky Mejia is a 47 y.o. male  Chief Complaint  Patient presents with  . Establish Care  . Rash    Itchy rash ongoing for a while. Patient states he thinks he's allergic to the "D" medication in Zyrtec and Claritin. Very itchy. Both legs.   Pt here today to establish care. Hx of allergies and some GI issues followed by specialist. Last CPE was over a year ago.   Uses triamcinolone several times daily for itchy rash on b/l legs that's been present the past few weeks which helps but it keeps coming back, also taking zyrtec D.Starting to wonder if he's allergic to the decongestant portion of the medication as it seemed to start around the same time. No other new products or exposures known.   Relevant past medical, surgical, family and social history reviewed and updated as indicated. Interim medical history since our last visit reviewed. Allergies and medications reviewed and updated.  Review of Systems  Per HPI unless specifically indicated above     Objective:    BP 127/85 (BP Location: Right Arm, Patient Position: Sitting, Cuff Size: Normal)   Pulse 76   Temp 98.4 F (36.9 C) (Oral)   Ht 5' 9"  (1.753 m)   Wt 259 lb 8 oz (117.7 kg)   SpO2 98%   BMI 38.32 kg/m   Wt Readings from Last 3 Encounters:  07/22/17 259 lb 8 oz (117.7 kg)  12/11/15 230 lb (104.3 kg)  06/04/15 260 lb (117.9 kg)    Physical Exam  Constitutional: He is oriented to person, place, and time. He appears well-developed and well-nourished.  HENT:  Head: Atraumatic.  Oropharynx and nasal mucosa injected, rhinorrhea present  Eyes: Pupils are equal, round, and reactive to light. Conjunctivae  are normal.  Neck: Normal range of motion. Neck supple.  Cardiovascular: Normal rate and regular rhythm.  Pulmonary/Chest: Effort normal and breath sounds normal.  Musculoskeletal: Normal range of motion.  Neurological: He is alert and oriented to person, place, and time.  Skin: Skin is warm and dry. Rash (Erythematous maculopapular rash diffusely across b/l LEs. Scabbing in some areas from signifiant scratching) noted.  Psychiatric: He has a normal mood and affect. His behavior is normal.  Nursing note and vitals reviewed.   Results for orders placed or performed in visit on 07/22/17  HM COLONOSCOPY  Result Value Ref Range   HM Colonoscopy See Report (in chart) See Report (in chart), Patient Reported      Assessment & Plan:   Problem List Items Addressed This Visit      Respiratory   Allergic rhinitis - Primary    Start xyzal and singulair for poorly controlled allergy sxs. Monitor closely for benefit. Intolerant to nasal sprays. Humidifier, sinus rinses as tolerated       Other Visit Diagnoses    Encounter to establish care       Rash       Difficult to discern eczema vs hives as he's itched it so much. Continue triamcinolone, moisturize, no scented products. Switch to  xyzal from zyrtec D       Follow up plan: Return for CPE.

## 2017-07-24 DIAGNOSIS — J309 Allergic rhinitis, unspecified: Secondary | ICD-10-CM | POA: Insufficient documentation

## 2017-07-24 NOTE — Assessment & Plan Note (Signed)
Start xyzal and singulair for poorly controlled allergy sxs. Monitor closely for benefit. Intolerant to nasal sprays. Humidifier, sinus rinses as tolerated

## 2017-07-24 NOTE — Patient Instructions (Signed)
Follow up for CPE

## 2017-09-16 ENCOUNTER — Encounter: Payer: BLUE CROSS/BLUE SHIELD | Admitting: Family Medicine

## 2017-11-03 ENCOUNTER — Encounter: Payer: Self-pay | Admitting: Unknown Physician Specialty

## 2017-11-03 ENCOUNTER — Ambulatory Visit (INDEPENDENT_AMBULATORY_CARE_PROVIDER_SITE_OTHER): Payer: BLUE CROSS/BLUE SHIELD | Admitting: Unknown Physician Specialty

## 2017-11-03 VITALS — BP 139/104 | HR 99 | Temp 98.2°F | Ht 69.0 in | Wt 271.0 lb

## 2017-11-03 DIAGNOSIS — L309 Dermatitis, unspecified: Secondary | ICD-10-CM

## 2017-11-03 DIAGNOSIS — R42 Dizziness and giddiness: Secondary | ICD-10-CM

## 2017-11-03 DIAGNOSIS — K625 Hemorrhage of anus and rectum: Secondary | ICD-10-CM

## 2017-11-03 DIAGNOSIS — J309 Allergic rhinitis, unspecified: Secondary | ICD-10-CM | POA: Diagnosis not present

## 2017-11-03 DIAGNOSIS — I10 Essential (primary) hypertension: Secondary | ICD-10-CM

## 2017-11-03 DIAGNOSIS — Z0000 Encounter for general adult medical examination without abnormal findings: Secondary | ICD-10-CM | POA: Diagnosis not present

## 2017-11-03 DIAGNOSIS — Z0001 Encounter for general adult medical examination with abnormal findings: Secondary | ICD-10-CM | POA: Diagnosis not present

## 2017-11-03 MED ORDER — TRIAMCINOLONE ACETONIDE 0.1 % EX CREA: TOPICAL_CREAM | Freq: Two times a day (BID) | CUTANEOUS | 3 refills | Status: DC | PRN

## 2017-11-03 NOTE — Patient Instructions (Signed)
DASH Eating Plan DASH stands for "Dietary Approaches to Stop Hypertension." The DASH eating plan is a healthy eating plan that has been shown to reduce high blood pressure (hypertension). It may also reduce your risk for type 2 diabetes, heart disease, and stroke. The DASH eating plan may also help with weight loss. What are tips for following this plan? General guidelines  Avoid eating more than 2,300 mg (milligrams) of salt (sodium) a day. If you have hypertension, you may need to reduce your sodium intake to 1,500 mg a day.  Limit alcohol intake to no more than 1 drink a day for nonpregnant women and 2 drinks a day for men. One drink equals 12 oz of beer, 5 oz of wine, or 1 oz of hard liquor.  Work with your health care provider to maintain a healthy body weight or to lose weight. Ask what an ideal weight is for you.  Get at least 30 minutes of exercise that causes your heart to beat faster (aerobic exercise) most days of the week. Activities may include walking, swimming, or biking.  Work with your health care provider or diet and nutrition specialist (dietitian) to adjust your eating plan to your individual calorie needs. Reading food labels  Check food labels for the amount of sodium per serving. Choose foods with less than 5 percent of the Daily Value of sodium. Generally, foods with less than 300 mg of sodium per serving fit into this eating plan.  To find whole grains, look for the word "whole" as the first word in the ingredient list. Shopping  Buy products labeled as "low-sodium" or "no salt added."  Buy fresh foods. Avoid canned foods and premade or frozen meals. Cooking  Avoid adding salt when cooking. Use salt-free seasonings or herbs instead of table salt or sea salt. Check with your health care provider or pharmacist before using salt substitutes.  Do not fry foods. Cook foods using healthy methods such as baking, boiling, grilling, and broiling instead.  Cook with  heart-healthy oils, such as olive, canola, soybean, or sunflower oil. Meal planning   Eat a balanced diet that includes: ? 5 or more servings of fruits and vegetables each day. At each meal, try to fill half of your plate with fruits and vegetables. ? Up to 6-8 servings of whole grains each day. ? Less than 6 oz of lean meat, poultry, or fish each day. A 3-oz serving of meat is about the same size as a deck of cards. One egg equals 1 oz. ? 2 servings of low-fat dairy each day. ? A serving of nuts, seeds, or beans 5 times each week. ? Heart-healthy fats. Healthy fats called Omega-3 fatty acids are found in foods such as flaxseeds and coldwater fish, like sardines, salmon, and mackerel.  Limit how much you eat of the following: ? Canned or prepackaged foods. ? Food that is high in trans fat, such as fried foods. ? Food that is high in saturated fat, such as fatty meat. ? Sweets, desserts, sugary drinks, and other foods with added sugar. ? Full-fat dairy products.  Do not salt foods before eating.  Try to eat at least 2 vegetarian meals each week.  Eat more home-cooked food and less restaurant, buffet, and fast food.  When eating at a restaurant, ask that your food be prepared with less salt or no salt, if possible. What foods are recommended? The items listed may not be a complete list. Talk with your dietitian about what   dietary choices are best for you. Grains Whole-grain or whole-wheat bread. Whole-grain or whole-wheat pasta. Brown rice. Oatmeal. Quinoa. Bulgur. Whole-grain and low-sodium cereals. Pita bread. Low-fat, low-sodium crackers. Whole-wheat flour tortillas. Vegetables Fresh or frozen vegetables (raw, steamed, roasted, or grilled). Low-sodium or reduced-sodium tomato and vegetable juice. Low-sodium or reduced-sodium tomato sauce and tomato paste. Low-sodium or reduced-sodium canned vegetables. Fruits All fresh, dried, or frozen fruit. Canned fruit in natural juice (without  added sugar). Meat and other protein foods Skinless chicken or turkey. Ground chicken or turkey. Pork with fat trimmed off. Fish and seafood. Egg whites. Dried beans, peas, or lentils. Unsalted nuts, nut butters, and seeds. Unsalted canned beans. Lean cuts of beef with fat trimmed off. Low-sodium, lean deli meat. Dairy Low-fat (1%) or fat-free (skim) milk. Fat-free, low-fat, or reduced-fat cheeses. Nonfat, low-sodium ricotta or cottage cheese. Low-fat or nonfat yogurt. Low-fat, low-sodium cheese. Fats and oils Soft margarine without trans fats. Vegetable oil. Low-fat, reduced-fat, or light mayonnaise and salad dressings (reduced-sodium). Canola, safflower, olive, soybean, and sunflower oils. Avocado. Seasoning and other foods Herbs. Spices. Seasoning mixes without salt. Unsalted popcorn and pretzels. Fat-free sweets. What foods are not recommended? The items listed may not be a complete list. Talk with your dietitian about what dietary choices are best for you. Grains Baked goods made with fat, such as croissants, muffins, or some breads. Dry pasta or rice meal packs. Vegetables Creamed or fried vegetables. Vegetables in a cheese sauce. Regular canned vegetables (not low-sodium or reduced-sodium). Regular canned tomato sauce and paste (not low-sodium or reduced-sodium). Regular tomato and vegetable juice (not low-sodium or reduced-sodium). Pickles. Olives. Fruits Canned fruit in a light or heavy syrup. Fried fruit. Fruit in cream or butter sauce. Meat and other protein foods Fatty cuts of meat. Ribs. Fried meat. Bacon. Sausage. Bologna and other processed lunch meats. Salami. Fatback. Hotdogs. Bratwurst. Salted nuts and seeds. Canned beans with added salt. Canned or smoked fish. Whole eggs or egg yolks. Chicken or turkey with skin. Dairy Whole or 2% milk, cream, and half-and-half. Whole or full-fat cream cheese. Whole-fat or sweetened yogurt. Full-fat cheese. Nondairy creamers. Whipped toppings.  Processed cheese and cheese spreads. Fats and oils Butter. Stick margarine. Lard. Shortening. Ghee. Bacon fat. Tropical oils, such as coconut, palm kernel, or palm oil. Seasoning and other foods Salted popcorn and pretzels. Onion salt, garlic salt, seasoned salt, table salt, and sea salt. Worcestershire sauce. Tartar sauce. Barbecue sauce. Teriyaki sauce. Soy sauce, including reduced-sodium. Steak sauce. Canned and packaged gravies. Fish sauce. Oyster sauce. Cocktail sauce. Horseradish that you find on the shelf. Ketchup. Mustard. Meat flavorings and tenderizers. Bouillon cubes. Hot sauce and Tabasco sauce. Premade or packaged marinades. Premade or packaged taco seasonings. Relishes. Regular salad dressings. Where to find more information:  National Heart, Lung, and Blood Institute: www.nhlbi.nih.gov  American Heart Association: www.heart.org Summary  The DASH eating plan is a healthy eating plan that has been shown to reduce high blood pressure (hypertension). It may also reduce your risk for type 2 diabetes, heart disease, and stroke.  With the DASH eating plan, you should limit salt (sodium) intake to 2,300 mg a day. If you have hypertension, you may need to reduce your sodium intake to 1,500 mg a day.  When on the DASH eating plan, aim to eat more fresh fruits and vegetables, whole grains, lean proteins, low-fat dairy, and heart-healthy fats.  Work with your health care provider or diet and nutrition specialist (dietitian) to adjust your eating plan to your individual   calorie needs. This information is not intended to replace advice given to you by your health care provider. Make sure you discuss any questions you have with your health care provider. Document Released: 03/20/2011 Document Revised: 03/24/2016 Document Reviewed: 03/24/2016 Elsevier Interactive Patient Education  2018 Elsevier Inc.  

## 2017-11-03 NOTE — Assessment & Plan Note (Addendum)
BP 130/94 on recheck.  Discussed DASH diet.  Recheck in 2 weeks

## 2017-11-03 NOTE — Assessment & Plan Note (Signed)
Refilled steroid cream.  Recheck in one month.

## 2017-11-03 NOTE — Progress Notes (Signed)
BP (!) 139/104   Pulse 99   Temp 98.2 F (36.8 C) (Oral)   Ht 5' 9"  (1.753 m)   Wt 271 lb (122.9 kg)   SpO2 97%   BMI 40.02 kg/m    Subjective:    Patient ID: Ricky Mejia, male    DOB: 05/11/1970, 47 y.o.   MRN: [id number]  HPI: Ricky Mejia is a 47 y.o. male  Chief Complaint  Patient presents with  . Annual Exam   Still having trouble with allergies.  Taking Zyrtec D.  Give Singulair which didn't help.   States he is unable to spray anything in his nose and steroid nasal sprays are not an option for him.    Lightheaded Pt states he is getting light-headed when working a physical job.  Drinks a lot of fluids which is mostly water and Gatorade.  Started taking iron pills which helped.    Depression screen [facility] 2/9 11/03/2017  Decreased Interest 0  Down, Depressed, Hopeless 0  PHQ - 2 Score 0  Altered sleeping 0  Tired, decreased energy 1  Change in appetite 0  Feeling bad or failure about yourself  0  Trouble concentrating 0  Moving slowly or fidgety/restless 0  Suicidal thoughts 0  PHQ-9 Score 1     Social History   Socioeconomic History  . Marital status: Divorced    Spouse name: Not on file  . Number of children: Not on file  . Years of education: Not on file  . Highest education level: Not on file  Occupational History  . Not on file  Social Needs  . Financial resource strain: Not on file  . Food insecurity:    Worry: Not on file    Inability: Not on file  . Transportation needs:    Medical: Not on file    Non-medical: Not on file  Tobacco Use  . Smoking status: Never Smoker  . Smokeless tobacco: Current User  Substance and Sexual Activity  . Alcohol use: No  . Drug use: No  . Sexual activity: Not on file  Lifestyle  . Physical activity:    Days per week: Not on file    Minutes per session: Not on file  . Stress: Not on file  Relationships  . Social connections:    Talks on phone: Not on file    Gets together: Not on file   Attends religious service: Not on file    Active member of club or organization: Not on file    Attends meetings of clubs or organizations: Not on file    Relationship status: Not on file  . Intimate partner violence:    Fear of current or ex partner: Not on file    Emotionally abused: Not on file    Physically abused: Not on file    Forced sexual activity: Not on file  Other Topics Concern  . Not on file  Social History Narrative  . Not on file   Family History  Problem Relation Age of Onset  . Lupus Mother   . Lung cancer Father   . Diabetes Mellitus I Father   . Osteoarthritis Maternal Grandmother   . Osteoarthritis Maternal Grandfather   . Colon cancer Maternal Grandfather   . Osteoarthritis Paternal Grandmother   . Osteoarthritis Paternal Grandfather    Past Medical History:  Diagnosis Date  . Abnormal LFTs   . Allergic rhinitis   . Diverticulitis   . HUS (hemolytic uremic syndrome) (  [name])   . Hyperplastic colon polyp   . Obesity   . Serrated adenoma of colon   . 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 COLONOSCOPY    . SIGMOIDOSCOPY    . WISDOM TOOTH EXTRACTION       Relevant past medical, surgical, family and social history reviewed and updated as indicated. Interim medical history since our last visit reviewed. Allergies and medications reviewed and updated.  Review of Systems  Constitutional: Positive for fatigue.  HENT: Negative.   Eyes: Negative.   Respiratory: Negative.   Cardiovascular: Negative.   Gastrointestinal:       Rectal bleeding  Endocrine: Negative.   Genitourinary: Negative for frequency.  Musculoskeletal: Negative.   Skin: Negative.   Allergic/Immunologic: Negative.   Neurological: Negative.   Hematological: Negative.   Psychiatric/Behavioral: Negative.     Per HPI unless specifically indicated above     Objective:    BP (!) 139/104   Pulse 99   Temp 98.2 F (36.8 C) (Oral)   Ht 5'  9" (1.753 m)   Wt 271 lb (122.9 kg)   SpO2 97%   BMI 40.02 kg/m   Wt Readings from Last 3 Encounters:  11/03/17 271 lb (122.9 kg)  07/22/17 259 lb 8 oz (117.7 kg)  12/11/15 230 lb (104.3 kg)    Physical Exam  Constitutional: He is oriented to person, place, and time. He appears well-developed and well-nourished.  HENT:  Head: Normocephalic.  Right Ear: Tympanic membrane, external ear and ear canal normal.  Left Ear: Tympanic membrane, external ear and ear canal normal.  Mouth/Throat: Uvula is midline, oropharynx is clear and moist and mucous membranes are normal.  Eyes: Pupils are equal, round, and reactive to light.  Cardiovascular: Normal rate, regular rhythm and normal heart sounds. Exam reveals no gallop and no friction rub.  No murmur heard. EKG NSR.  No acute changes.  No STTW changes  Pulmonary/Chest: Effort normal and breath sounds normal. No respiratory distress.  Abdominal: Soft. Bowel sounds are normal. He exhibits no distension. There is no tenderness.  Musculoskeletal: Normal range of motion.  Neurological: He is alert and oriented to person, place, and time. He has normal reflexes.  Skin: Skin is warm and dry.  Red patches on thigh.  Cleared up with cream which returned  Psychiatric: He has a normal mood and affect. His behavior is normal. Judgment and thought content normal.    Results for orders placed or performed in visit on 07/22/17  HM COLONOSCOPY  Result Value Ref Range   HM Colonoscopy See Report (in chart) See Report (in chart), Patient Reported      Assessment & Plan:   Problem List Items Addressed This Visit      Unprioritized   Allergic rhinitis    Refer to ENT as unable to stop medication with a decongestant and intolerant to nasal spray.  Suspect sleep apnea but wants to see ENT first to see if procedure can be done.  Will defer testing to ENT      Relevant Orders   Ambulatory referral to ENT   Benign hypertension    BP 130/94 on recheck.   Discussed DASH diet.  Recheck in 2 weeks      Eczema    Refilled steroid cream.  Recheck in one month.         Other Visit Diagnoses    Light headed    -  Primary  Taking iron so CBC possibly not reflective.  Check today and recheck in 4 weeks off of iron supplement.  Come in sooner if symptoms return before 4 weeks   Relevant Orders   EKG 12-Lead (Completed)   Ambulatory referral to Gastroenterology   Ambulatory referral to ENT   Annual physical exam       Relevant Orders   CBC with Differential/Platelet   Comprehensive metabolic panel   Lipid Panel w/o Chol/HDL Ratio   TSH   Rectal bleeding       Refer to GI as close to screening age.     Relevant Orders   Ambulatory referral to Gastroenterology       Follow up plan: Return in about 1 month (around 12/01/2017) for f/u with Merrie Roof.

## 2017-11-03 NOTE — Assessment & Plan Note (Signed)
Refer to ENT as unable to stop medication with a decongestant and intolerant to nasal spray.  Suspect sleep apnea but wants to see ENT first to see if procedure can be done.  Will defer testing to ENT

## 2017-11-04 ENCOUNTER — Encounter: Payer: Self-pay | Admitting: Gastroenterology

## 2017-11-04 ENCOUNTER — Telehealth: Payer: Self-pay | Admitting: Unknown Physician Specialty

## 2017-11-04 DIAGNOSIS — R748 Abnormal levels of other serum enzymes: Secondary | ICD-10-CM

## 2017-11-04 LAB — LIPID PANEL W/O CHOL/HDL RATIO
Cholesterol, Total: 184 mg/dL (ref 100–199)
HDL: 40 mg/dL (ref >39)
LDL Calculated: 110 mg/dL — ABNORMAL HIGH (ref 0–99)
TRIGLYCERIDES: 170 mg/dL — AB (ref 0–149)
VLDL CHOLESTEROL CAL: 34 mg/dL (ref 5–40)

## 2017-11-04 LAB — COMPREHENSIVE METABOLIC PANEL
ALT: 92 IU/L — AB (ref 0–44)
AST: 64 IU/L — AB (ref 0–40)
Albumin/Globulin Ratio: 2 (ref 1.2–2.2)
Albumin: 4.9 g/dL (ref 3.5–5.5)
Alkaline Phosphatase: 79 IU/L (ref 39–117)
BUN/Creatinine Ratio: 12 (ref 9–20)
BUN: 13 mg/dL (ref 6–24)
Bilirubin Total: 0.7 mg/dL (ref 0.0–1.2)
CALCIUM: 9.9 mg/dL (ref 8.7–10.2)
CO2: 18 mmol/L — AB (ref 20–29)
CREATININE: 1.06 mg/dL (ref 0.76–1.27)
Chloride: 106 mmol/L (ref 96–106)
GFR, EST AFRICAN AMERICAN: 97 mL/min/{1.73_m2} (ref >59)
GFR, EST NON AFRICAN AMERICAN: 84 mL/min/{1.73_m2} (ref >59)
GLUCOSE: 86 mg/dL (ref 65–99)
Globulin, Total: 2.5 g/dL (ref 1.5–4.5)
Potassium: 4.5 mmol/L (ref 3.5–5.2)
Sodium: 145 mmol/L — ABNORMAL HIGH (ref 134–144)
TOTAL PROTEIN: 7.4 g/dL (ref 6.0–8.5)

## 2017-11-04 LAB — CBC WITH DIFFERENTIAL/PLATELET
BASOS ABS: 0 10*3/uL (ref 0.0–0.2)
Basos: 0 %
EOS (ABSOLUTE): 0.1 10*3/uL (ref 0.0–0.4)
Eos: 1 %
Hematocrit: 48 % (ref 37.5–51.0)
Hemoglobin: 17 g/dL (ref 13.0–17.7)
IMMATURE GRANS (ABS): 0 10*3/uL (ref 0.0–0.1)
IMMATURE GRANULOCYTES: 0 %
LYMPHS: 29 %
Lymphocytes Absolute: 1.8 10*3/uL (ref 0.7–3.1)
MCH: 33 pg (ref 26.6–33.0)
MCHC: 35.4 g/dL (ref 31.5–35.7)
MCV: 93 fL (ref 79–97)
MONOS ABS: 0.6 10*3/uL (ref 0.1–0.9)
Monocytes: 9 %
NEUTROS PCT: 61 %
Neutrophils Absolute: 3.7 10*3/uL (ref 1.4–7.0)
PLATELETS: 234 10*3/uL (ref 150–450)
RBC: 5.15 x10E6/uL (ref 4.14–5.80)
RDW: 14.4 % (ref 12.3–15.4)
WBC: 6.3 10*3/uL (ref 3.4–10.8)

## 2017-11-04 LAB — TSH: TSH: 2.27 u[IU]/mL (ref 0.450–4.500)

## 2017-11-04 NOTE — Telephone Encounter (Signed)
Called and left patient a VM asking him to please return my call for results. OK for P[facility] to let patient know about results and lab orders.

## 2017-11-04 NOTE — Telephone Encounter (Signed)
I signed off his labs by mistake.  Please let him know labs were normal exept elevated liver enzymes.  I would like to check hepatitis labs on his next visit.  I put them in the chart

## 2017-11-05 NOTE — Telephone Encounter (Signed)
Called and left patient a VM asking for him to please return my call.

## 2017-11-06 NOTE — Telephone Encounter (Signed)
Tried calling patient. VM box was full so I could not leave a VM. Will try to call again Monday.

## 2017-11-09 NOTE — Telephone Encounter (Signed)
Tried calling patient again, still no answer and no VM box available. Ricky Mejia, I have tried to contact this patient multiple times. Can I send a letter to the patient with this information?

## 2017-11-10 ENCOUNTER — Encounter: Payer: Self-pay | Admitting: Unknown Physician Specialty

## 2017-11-11 NOTE — Telephone Encounter (Signed)
Letter printed by Malachy Mood and mailed to the patient.

## 2017-11-23 DIAGNOSIS — J3489 Other specified disorders of nose and nasal sinuses: Secondary | ICD-10-CM | POA: Diagnosis not present

## 2017-11-23 DIAGNOSIS — J309 Allergic rhinitis, unspecified: Secondary | ICD-10-CM | POA: Diagnosis not present

## 2017-11-30 ENCOUNTER — Emergency Department: Payer: BLUE CROSS/BLUE SHIELD

## 2017-11-30 ENCOUNTER — Emergency Department
Admission: EM | Admit: 2017-11-30 | Discharge: 2017-11-30 | Disposition: A | Discharge status: Home / Self Care | Payer: BLUE CROSS/BLUE SHIELD | Attending: Emergency Medicine | Admitting: Emergency Medicine

## 2017-11-30 ENCOUNTER — Encounter: Payer: Self-pay | Admitting: Emergency Medicine

## 2017-11-30 DIAGNOSIS — R0789 Other chest pain: Secondary | ICD-10-CM | POA: Diagnosis not present

## 2017-11-30 DIAGNOSIS — I10 Essential (primary) hypertension: Secondary | ICD-10-CM | POA: Diagnosis not present

## 2017-11-30 DIAGNOSIS — R079 Chest pain, unspecified: Secondary | ICD-10-CM | POA: Diagnosis not present

## 2017-11-30 DIAGNOSIS — Z79899 Other long term (current) drug therapy: Secondary | ICD-10-CM | POA: Diagnosis not present

## 2017-11-30 LAB — TROPONIN I

## 2017-11-30 LAB — CBC
HCT: 50.7 % (ref 40.0–52.0)
Hemoglobin: 18.1 g/dL — ABNORMAL HIGH (ref 13.0–18.0)
MCH: 33.2 pg (ref 26.0–34.0)
MCHC: 35.8 g/dL (ref 32.0–36.0)
MCV: 92.8 fL (ref 80.0–100.0)
PLATELETS: 244 10*3/uL (ref 150–440)
RBC: 5.46 MIL/uL (ref 4.40–5.90)
RDW: 13.5 % (ref 11.5–14.5)
WBC: 9.7 10*3/uL (ref 3.8–10.6)

## 2017-11-30 LAB — BASIC METABOLIC PANEL
Anion gap: 9 (ref 5–15)
BUN: 9 mg/dL (ref 6–20)
CALCIUM: 9.9 mg/dL (ref 8.9–10.3)
CO2: 25 mmol/L (ref 22–32)
CREATININE: 1.02 mg/dL (ref 0.61–1.24)
Chloride: 105 mmol/L (ref 98–111)
GFR calc Af Amer: >60 mL/min (ref >60)
GLUCOSE: 132 mg/dL — AB (ref 70–99)
Potassium: 4.3 mmol/L (ref 3.5–5.1)
SODIUM: 139 mmol/L (ref 135–145)

## 2017-11-30 LAB — FIBRIN DERIVATIVES D-DIMER (ARMC ONLY): FIBRIN DERIVATIVES D-DIMER (ARMC): 305.09 ng{FEU}/mL (ref 0.00–499.00)

## 2017-11-30 MED ORDER — ASPIRIN 81 MG PO CHEW
324.0000 mg | CHEWABLE_TABLET | Freq: Once | ORAL | Status: AC
  Administered 2017-11-30: 324 mg via ORAL
  Filled 2017-11-30: qty 4

## 2017-11-30 MED ORDER — IBUPROFEN 600 MG PO TABS
600.0000 mg | ORAL_TABLET | ORAL | Status: AC
  Administered 2017-11-30: 600 mg via ORAL
  Filled 2017-11-30: qty 1

## 2017-11-30 NOTE — ED Provider Notes (Signed)
[facility] Emergency Department Provider Note ____________________________________________   First MD Initiated Contact with Patient 11/30/17 1532     (approximate)  I have reviewed the triage vital signs and the nursing notes.   HISTORY  Chief Complaint Chest Pain and Dizziness  HPI Ricky Mejia is a 47 y.o. male presents for evaluation for chest pain  Patient reports for a couple of weeks now has been experiencing some intermittent discomfort in his chest.  Seems to be a little bit worse with a cough and nasal congestion which is been experiencing now for several weeks and is seeing ear nose and throat and was prescribed a nasal spray that he reports he could not afford so he has not seen any improvement but he was told he has allergies.  No nausea vomiting.  No radiation of pain.  Pain comes and goes, worse with coughing and deep inspiration primarily over the right side of the rib cage.  Feeling slightly fatigued today but no shortness of breath  No history of cardiac disease.   Past Medical History:  Diagnosis Date  . Abnormal LFTs   . Allergic rhinitis   . Diverticulitis   . HUS (hemolytic uremic syndrome) ([place])   . Hyperplastic colon polyp   . Obesity   . Serrated adenoma of colon   . 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 Eczema 11/03/2017  . Benign hypertension 11/03/2017  . Allergic rhinitis 07/24/2017    Past Surgical History:  Procedure Laterality Date  . COLONOSCOPY    . SIGMOIDOSCOPY    . WISDOM TOOTH EXTRACTION      Prior to Admission medications   Medication Sig Start Date End Date Taking? Authorizing Provider  IRON PO Take 1 tablet by mouth daily. Pt unsure of dose    Yes [provider]  Multiple Vitamin (MULTIVITAMIN) tablet Take 1 tablet by mouth daily.   Yes [provider]  triamcinolone cream (KENALOG) 0.1 % Apply topically 2 (two) times daily as  needed. 11/03/17 11/03/18  Kathrine Haddock, NP    Allergies Flonase [fluticasone propionate] and Sulfa antibiotics  Family History  Problem Relation Age of Onset  . Lupus Mother   . Lung cancer Father   . Diabetes Mellitus I Father   . Osteoarthritis Maternal Grandmother   . Osteoarthritis Maternal Grandfather   . Colon cancer Maternal Grandfather   . Osteoarthritis Paternal Grandmother   . Osteoarthritis Paternal Grandfather     Social History Social History   Tobacco Use  . Smoking status: Never Smoker  . Smokeless tobacco: Current User  Substance Use Topics  . Alcohol use: No  . Drug use: No    Review of Systems Constitutional: No fever/chills some slight fatigue Eyes: No visual changes. ENT: No sore throat.  Runny nose for a few weeks time. Cardiovascular: See HPI Respiratory: Denies shortness of breath. Gastrointestinal: No abdominal pain.  No nausea, no vomiting.  No diarrhea.  No constipation. Genitourinary: Negative for dysuria. Musculoskeletal: Negative for back pain. Skin: Negative for rash. Neurological: Negative for headaches, focal weakness or numbness.    ____________________________________________   PHYSICAL EXAM:  VITAL SIGNS: ED Triage Vitals  Enc Vitals Group     BP 11/30/17 1425 (!) 142/105     Pulse Rate 11/30/17 1425 (!) 133     Resp 11/30/17 1425 20     Temp 11/30/17 1425 98.7 F (37.1 C)  Temp Source 11/30/17 1425 Oral     SpO2 11/30/17 1425 96 %     Weight 11/30/17 1426 270 lb (122.5 kg)     Height 11/30/17 1426 6' 1"  (1.854 m)     Head Circumference --      Peak Flow --      Pain Score 11/30/17 1425 5     Pain Loc --      Pain Edu? --      Excl. in [place]? --     Constitutional: Alert and oriented. Well appearing and in no acute distress. Eyes: Conjunctivae are normal. Head: Atraumatic. Nose: Some slight clear coryza at times with occasional sniffling. Mouth/Throat: Mucous membranes are moist. Neck: No stridor.     Cardiovascular: Normal rate, regular rhythm. Grossly normal heart sounds.  Good peripheral circulation. Respiratory: Normal respiratory effort.  No retractions. Lungs CTAB. Gastrointestinal: Soft and nontender. No distention. Musculoskeletal: No lower extremity tenderness nor edema. Neurologic:  Normal speech and language. No gross focal neurologic deficits are appreciated.  Skin:  Skin is warm, dry and intact. No rash noted. Psychiatric: Mood and affect are normal. Speech and behavior are normal.  ____________________________________________   LABS (all labs ordered are listed, but only abnormal results are displayed)  Labs Reviewed  BASIC METABOLIC PANEL - Abnormal; Notable for the following components:      Result Value   Glucose, Bld 132 (*)    All other components within normal limits  CBC - Abnormal; Notable for the following components:   Hemoglobin 18.1 (*)    All other components within normal limits  TROPONIN I  FIBRIN DERIVATIVES D-DIMER ([facility] ONLY)  TROPONIN I   ____________________________________________  EKG  Reviewed and interpreted by me at 1420 Heart rate 100 QRS 85 QTc 420 Sinus tachycardia, no evidence of acute ischemia. ____________________________________________  RADIOLOGY  Dg Chest 2 View  Result Date: 11/30/2017 CLINICAL DATA:  Intermittent sharp mid chest pain since the weekend, worse today, associated dizziness since yesterday, nausea EXAM: CHEST - 2 VIEW COMPARISON:  08/14/2009 FINDINGS: Normal heart size, mediastinal contours, and pulmonary vascularity. Lungs clear. No infiltrate, pleural effusion or pneumothorax. No acute osseous findings. IMPRESSION: No acute abnormalities. Electronically Signed   By: Lavonia Dana M.D.   On: 11/30/2017 14:41    Chest x-ray results reviewed by me and interpreted as no acute findings ____________________________________________   PROCEDURES  Procedure(s) performed: None  Procedures  Critical Care  performed: No  ____________________________________________   INITIAL IMPRESSION / ASSESSMENT AND PLAN / ED COURSE  Pertinent labs & imaging results that were available during my care of the patient were reviewed by me and considered in my medical decision making (see chart for details).  Differential diagnosis includes, but is not limited to, ACS, aortic dissection, pulmonary embolism, cardiac tamponade, pneumothorax, pneumonia, pericarditis, myocarditis, GI-related causes including esophagitis/gastritis, and musculoskeletal chest wall pain.    Based on the clinical history with chest discomfort coughing and runny nose suspect may be possibly viral upper respiratory infection or seasonal allergies with musculoskeletal strain or pleurisy.  Low risk for pulmonary embolism with negative d-dimer, effectively excluding this concern as well as 2 normal troponins with very atypical history doubt cardiac disease.     Clinical Course as of Nov 30 1844  Mon Nov 30, 2017  1745 Patient is resting comfortably.  Discussed with Dr. Kathyrn Sheriff of ENT, he advises patient changed to Nasacort nasal spray.   [MQ]    Clinical Course User Index [MQ] Delman Kitten,  MD    Return precautions and treatment recommendations and follow-up discussed with the patient who is agreeable with the plan.  ____________________________________________   FINAL CLINICAL IMPRESSION(S) / ED DIAGNOSES  Final diagnoses:  Atypical chest pain      NEW MEDICATIONS STARTED DURING THIS VISIT:  New Prescriptions   No medications on file     Note:  This document was prepared using Dragon voice recognition software and may include unintentional dictation errors.     Delman Kitten, MD 11/30/17 [phone]

## 2017-11-30 NOTE — Discharge Instructions (Addendum)
You have been seen in the Emergency Department (ED) today for chest pain.  As we have discussed todays test results are normal, but you may require further testing.  Please follow up with the recommended doctor as instructed above in these documents regarding todays emergent visit and your recent symptoms to discuss further management.  Continue to take your regular medications. If you are not doing so already, please also take a daily baby aspirin (81 mg), at least until you follow up with your doctor.  Return to the Emergency Department (ED) if you experience any further chest pain/pressure/tightness, difficulty breathing, or sudden sweating, or other symptoms that concern you.   Dr. Kathyrn Sheriff (ENT) advises try Nasacort nasal spray in each nostril daily (doesn't dry the nose or cause nosebleeds nearly as often.) Use for 1 month to obtain full effect. Follow-up with Dr. Tami Ribas in about a month if nasal allergies are not improving.

## 2017-11-30 NOTE — ED Triage Notes (Signed)
Patient presents to the ED with intermittent sharp mid-chest pain that began over the weekend but patient reports feels worse today.  Patient reports dizziness as well that began yesterday.  Patient is also complaining of nausea and shortness of breath.  Patient's skin is slightly clammy.  Patient is ambulatory with steady gait and speaking in full sentences.

## 2017-12-01 ENCOUNTER — Telehealth: Payer: Self-pay | Admitting: Family Medicine

## 2017-12-01 NOTE — Telephone Encounter (Signed)
Patient walked in to be seen with chest pain etc was advised to go to the ER by Carles Collet

## 2017-12-07 ENCOUNTER — Other Ambulatory Visit: Payer: Self-pay

## 2017-12-07 ENCOUNTER — Ambulatory Visit: Payer: BLUE CROSS/BLUE SHIELD | Admitting: Family Medicine

## 2017-12-07 ENCOUNTER — Encounter: Payer: Self-pay | Admitting: Family Medicine

## 2017-12-07 VITALS — BP 130/83 | HR 109 | Temp 98.0°F | Ht 73.0 in | Wt 274.6 lb

## 2017-12-07 DIAGNOSIS — R0789 Other chest pain: Secondary | ICD-10-CM | POA: Diagnosis not present

## 2017-12-07 DIAGNOSIS — J309 Allergic rhinitis, unspecified: Secondary | ICD-10-CM

## 2017-12-07 NOTE — Progress Notes (Signed)
BP 130/83   Pulse (!) 109   Temp 98 F (36.7 C) (Oral)   Ht 6' 1"  (1.854 m)   Wt 274 lb 9.6 oz (124.6 kg)   SpO2 96%   BMI 36.23 kg/m    Subjective:    Patient ID: Ricky Mejia, male    DOB: [birth year], 47 y.o.   MRN: [id number]  HPI: Ricky Mejia is a 47 y.o. male  Chief Complaint  Patient presents with  . Allergies   Here today for ER f/u for atypical CP and congestion/cough. Cardiac workup in the ER negative, including EKG and troponins. Doing nasacort once daily now for his allergies and cold sxs which is helping quite a bit. Not taking any allergy pills, still waiting for the xyzal they sent to be approved. Denies DOE, syncope, fevers, chills.   Relevant past medical, surgical, family and social history reviewed and updated as indicated. Interim medical history since our last visit reviewed. Allergies and medications reviewed and updated.  Review of Systems  Per HPI unless specifically indicated above     Objective:    BP 130/83   Pulse (!) 109   Temp 98 F (36.7 C) (Oral)   Ht 6' 1"  (1.854 m)   Wt 274 lb 9.6 oz (124.6 kg)   SpO2 96%   BMI 36.23 kg/m   Wt Readings from Last 3 Encounters:  12/07/17 274 lb 9.6 oz (124.6 kg)  11/30/17 270 lb (122.5 kg)  11/03/17 271 lb (122.9 kg)    Physical Exam  Constitutional: He is oriented to person, place, and time. He appears well-developed and well-nourished. No distress.  HENT:  Head: Atraumatic.  Right Ear: External ear normal.  Left Ear: External ear normal.  Oropharynx and nasal mucosa erythematous  Eyes: Conjunctivae and EOM are normal.  Neck: Normal range of motion. Neck supple.  Cardiovascular: Normal rate and regular rhythm.  Pulmonary/Chest: Effort normal and breath sounds normal.  Musculoskeletal: Normal range of motion.  Neurological: He is alert and oriented to person, place, and time.  Skin: Skin is warm and dry.  Psychiatric: He has a normal mood and affect. His behavior is normal.    Nursing note and vitals reviewed.   Results for orders placed or performed during the hospital encounter of 46/96/29  Basic metabolic panel  Result Value Ref Range   Sodium 139 135 - 145 mmol/L   Potassium 4.3 3.5 - 5.1 mmol/L   Chloride 105 98 - 111 mmol/L   CO2 25 22 - 32 mmol/L   Glucose, Bld 132 (H) 70 - 99 mg/dL   BUN 9 6 - 20 mg/dL   Creatinine, Ser 1.02 0.61 - 1.24 mg/dL   Calcium 9.9 8.9 - 10.3 mg/dL   GFR calc non Af Amer >60 >60 mL/min   GFR calc Af Amer >60 >60 mL/min   Anion gap 9 5 - 15  CBC  Result Value Ref Range   WBC 9.7 3.8 - 10.6 K/uL   RBC 5.46 4.40 - 5.90 MIL/uL   Hemoglobin 18.1 (H) 13.0 - 18.0 g/dL   HCT 50.7 40.0 - 52.0 %   MCV 92.8 80.0 - 100.0 fL   MCH 33.2 26.0 - 34.0 pg   MCHC 35.8 32.0 - 36.0 g/dL   RDW 13.5 11.5 - 14.5 %   Platelets 244 150 - 440 K/uL  Troponin I  Result Value Ref Range   Troponin I <0.03 <0.03 ng/mL  Fibrin derivatives D-Dimer ([facility] only)  Result Value Ref Range   Fibrin derivatives D-dimer ([facility]) 305.09 0.00 - 499.00 ng/mL (FEU)  Troponin I  Result Value Ref Range   Troponin I <0.03 <0.03 ng/mL      Assessment & Plan:   Problem List Items Addressed This Visit      Respiratory   Allergic rhinitis    Improved with nasacort, start different OTC antihistamines while waiting for his xyzal to be approved. Sinus rinses and humidifier prn       Other Visit Diagnoses    Atypical chest pain    -  Primary   Stable, no recurrences since d/c. Workup neg. Continue to monitor.        Follow up plan: Return for CPE.

## 2017-12-10 NOTE — Patient Instructions (Signed)
Follow up for CPE

## 2017-12-10 NOTE — Assessment & Plan Note (Signed)
Improved with nasacort, start different OTC antihistamines while waiting for his xyzal to be approved. Sinus rinses and humidifier prn

## 2017-12-23 DIAGNOSIS — J3489 Other specified disorders of nose and nasal sinuses: Secondary | ICD-10-CM | POA: Diagnosis not present

## 2017-12-23 DIAGNOSIS — J309 Allergic rhinitis, unspecified: Secondary | ICD-10-CM | POA: Diagnosis not present

## 2017-12-24 DIAGNOSIS — J301 Allergic rhinitis due to pollen: Secondary | ICD-10-CM | POA: Diagnosis not present

## 2017-12-29 ENCOUNTER — Encounter: Payer: Self-pay | Admitting: Gastroenterology

## 2017-12-29 ENCOUNTER — Ambulatory Visit: Payer: BLUE CROSS/BLUE SHIELD | Admitting: Gastroenterology

## 2017-12-29 ENCOUNTER — Other Ambulatory Visit: Payer: Self-pay

## 2017-12-29 VITALS — BP 118/83 | HR 93 | Resp 18 | Ht 73.0 in | Wt 272.2 lb

## 2017-12-29 DIAGNOSIS — K625 Hemorrhage of anus and rectum: Secondary | ICD-10-CM | POA: Diagnosis not present

## 2017-12-29 DIAGNOSIS — R945 Abnormal results of liver function studies: Principal | ICD-10-CM

## 2017-12-29 DIAGNOSIS — D126 Benign neoplasm of colon, unspecified: Secondary | ICD-10-CM | POA: Diagnosis not present

## 2017-12-29 DIAGNOSIS — E669 Obesity, unspecified: Secondary | ICD-10-CM | POA: Insufficient documentation

## 2017-12-29 DIAGNOSIS — R7989 Other specified abnormal findings of blood chemistry: Secondary | ICD-10-CM

## 2017-12-29 NOTE — Progress Notes (Signed)
Ricky Darby, MD [address]  [place]  [place], [place] [address]  Main: [phone]  Fax: [phone]    Gastroenterology Consultation  Referring Provider:     Kathrine Haddock, NP Primary Care Physician:  Guadalupe Maple, MD Primary Gastroenterologist:  Dr. Cephas Mejia Reason for Consultation:     Rectal bleeding, history of colon polyps, elevated LFTs        HPI:   Ricky Mejia is a 47 y.o. male referred by Dr. Guadalupe Maple, MD  for consultation & management of rectal bleeding and elevated LFTs  Rectal bleeding:Patient reports having one episode of bleeding per rectum every month ago which was self-limited. He experiences intermittent bleeding per rectum. He had a colonoscopy in 2016 and was found to have sessile serrated adenoma in cecum which was removed in piecemeal. The procedure report is not available with me today. He denies any other GI symptoms  Elevated LFTs: His found to have elevated transaminases, about twice upper limit of normal since 2017. On imaging, he was found to have diffuse hepatic steatosis in 2017. His hemoglobin is on the upper limit of normal between 16-18g/dl. He does not demonstrate any symptoms of chronic liver disease. He chews tobacco since his high school  NSAIDs: none  Antiplts/Anticoagulants/Anti thrombotics: none Patient denies any known family history of GI malignancy Father with cancer, unknown type He denies any GI surgeries  GI Procedures: colonoscopy 2016 DIAGNOSIS:  A. COLON, CECAL POLYP; MUCOSAL BIOPSIES:  - FRAGMENTS OF SESSILE SERRATED ADENOMA.  - NO HIGH GRADE DYSPLASIA OR MALIGNANCY SEEN.   B. COLON, RANDOM; MUCOSAL BIOPSIES:  - WITHIN NORMAL LIMITS.  - NO FEATURES OF MICROSCOPIC/LYMPHOCYTIC OR COLLAGENOUS COLITIS  SEEN.  Colonoscopy 05/09/2010 Diagnosis:  CECUM POLYP COLD SNARE:  - ONE 0.1 CM FRAGMENT CONSISTENT WITH HYPERPLASTIC POLYP, ADMIXED  WITH FECAL MATERIAL.    Past Medical History:    Diagnosis Date  . Abnormal LFTs   . Allergic rhinitis   . Diverticulitis   . 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 Hyperplastic colon polyp   . Obesity   . Serrated adenoma of colon   . 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 COLONOSCOPY    . SIGMOIDOSCOPY    . WISDOM TOOTH EXTRACTION      Current Outpatient Medications:  .  levocetirizine (XYZAL) 5 MG tablet, TAKE 1 TABLET (5 MG TOTAL) BY MOUTH EVERY EVENING., Disp: , Rfl:  .  Multiple Vitamin (MULTIVITAMIN) tablet, Take 1 tablet by mouth daily., Disp: , Rfl:     Family History  Problem Relation Age of Onset  . Lupus Mother   . Lung cancer Father   . Diabetes Mellitus I Father   . Osteoarthritis Maternal Grandmother   . Osteoarthritis Maternal Grandfather   . Colon cancer Maternal Grandfather   . Osteoarthritis Paternal Grandmother   . Osteoarthritis Paternal Grandfather      Social History   Tobacco Use  . Smoking status: Never Smoker  . Smokeless tobacco: Current User  Substance Use Topics  . Alcohol use: No  . Drug use: No    Allergies as of 12/29/2017 - Review Complete 12/29/2017  Allergen Reaction Noted  . Flonase [fluticasone propionate] Other (See Comments) 07/22/2017  . Sulfa antibiotics Rash 09/27/2013    Review of Systems:    All systems reviewed and negative except where noted in HPI.   Physical Exam:  BP 118/83 (  BP Location: Left Arm, Patient Position: Sitting, Cuff Size: Large)   Pulse 93   Resp 18   Ht 6' 1"  (1.854 m)   Wt 272 lb 3.2 oz (123.5 kg)   BMI 35.91 kg/m  No LMP for male patient.  General:   Alert,  Well-developed, well-nourished, pleasant and cooperative in NAD Head:  Normocephalic and atraumatic. Eyes:  Sclera clear, no icterus.   Conjunctiva pink. Ears:  Normal auditory acuity. Nose:  No deformity, discharge, or lesions. Mouth:  No deformity or lesions,oropharynx pink & moist. Neck:  Supple; no masses or thyromegaly. Lungs:   Respirations even and unlabored.  Clear throughout to auscultation.   No wheezes, crackles, or rhonchi. No acute distress. Heart:  Regular rate and rhythm; no murmurs, clicks, rubs, or gallops. Abdomen:  Normal bowel sounds. Soft, obese,non-tender and non-distended without masses, hepatosplenomegaly or hernias noted.  No guarding or rebound tenderness.   Rectal: Not performed Msk:  Symmetrical without gross deformities. Good, equal movement & strength bilaterally. Pulses:  Normal pulses noted. Extremities:  No clubbing or edema.  No cyanosis. Neurologic:  Alert and oriented x3;  grossly normal neurologically. Skin:  Intact without significant lesions or rashes. No jaundice. Lymph Nodes:  No significant cervical adenopathy. Psych:  Alert and cooperative. Normal mood and affect.  Imaging Studies: reviewed  Assessment and Plan:   [name] is a 47 y.o. Caucasian male with obesity, BMI 35, history of colon adenoma, seen in consultation for rectal bleeding and chronically elevated LFTs  Rectal bleeding and history of polyp: Recommend colonoscopy for further evaluation  Elevated LFTs: Previous imaging revealed diffuse hepatic steatosis Performed secondary liver disease workup Discussed with him about healthy lifestyle, weight loss and exercise for management of fatty liver disease  I have discussed alternative options, risks & benefits,  which include, but are not limited to, bleeding, infection, perforation,respiratory complication & drug reaction.  The patient agrees with this plan & written consent will be obtained.    Follow up in 1 month   Ricky Darby, MD

## 2017-12-30 DIAGNOSIS — R945 Abnormal results of liver function studies: Secondary | ICD-10-CM | POA: Diagnosis not present

## 2018-01-01 LAB — HEPATITIS PANEL, ACUTE
Hep A IgM: NEGATIVE
Hep B C IgM: NEGATIVE
Hep C Virus Ab: <0.1 s/co ratio (ref 0.0–0.9)
Hepatitis B Surface Ag: NEGATIVE

## 2018-01-01 LAB — ANTI-SMOOTH MUSCLE ANTIBODY, IGG: SMOOTH MUSCLE AB: 7 U (ref 0–19)

## 2018-01-01 LAB — IRON AND TIBC
IRON SATURATION: 59 % — AB (ref 15–55)
IRON: 137 ug/dL (ref 38–169)
TIBC: 233 ug/dL — AB (ref 250–450)
UIBC: 96 ug/dL — AB (ref 111–343)

## 2018-01-01 LAB — CERULOPLASMIN: Ceruloplasmin: 20.2 mg/dL (ref 16.0–31.0)

## 2018-01-01 LAB — MITOCHONDRIAL ANTIBODIES

## 2018-01-06 ENCOUNTER — Encounter: Payer: Self-pay | Admitting: *Deleted

## 2018-01-07 ENCOUNTER — Ambulatory Visit: Payer: BLUE CROSS/BLUE SHIELD | Admitting: Certified Registered Nurse Anesthetist

## 2018-01-07 ENCOUNTER — Other Ambulatory Visit: Payer: Self-pay | Admitting: Gastroenterology

## 2018-01-07 ENCOUNTER — Ambulatory Visit
Admission: RE | Admit: 2018-01-07 | Discharge: 2018-01-07 | Disposition: A | Discharge status: Home / Self Care | Payer: BLUE CROSS/BLUE SHIELD | Source: Ambulatory Visit | Attending: Gastroenterology | Admitting: Gastroenterology

## 2018-01-07 ENCOUNTER — Encounter
Admission: RE | Disposition: A | Discharge status: Home / Self Care | Payer: Self-pay | Source: Ambulatory Visit | Attending: Gastroenterology

## 2018-01-07 ENCOUNTER — Encounter: Payer: Self-pay | Admitting: *Deleted

## 2018-01-07 DIAGNOSIS — Z86010 Personal history of colonic polyps: Secondary | ICD-10-CM | POA: Diagnosis not present

## 2018-01-07 DIAGNOSIS — Z6835 Body mass index (BMI) 35.0-35.9, adult: Secondary | ICD-10-CM | POA: Diagnosis not present

## 2018-01-07 DIAGNOSIS — Z860101 Personal history of adenomatous and serrated colon polyps: Secondary | ICD-10-CM

## 2018-01-07 DIAGNOSIS — K621 Rectal polyp: Secondary | ICD-10-CM | POA: Diagnosis not present

## 2018-01-07 DIAGNOSIS — D126 Benign neoplasm of colon, unspecified: Secondary | ICD-10-CM | POA: Diagnosis not present

## 2018-01-07 DIAGNOSIS — K635 Polyp of colon: Secondary | ICD-10-CM | POA: Diagnosis not present

## 2018-01-07 DIAGNOSIS — R945 Abnormal results of liver function studies: Secondary | ICD-10-CM

## 2018-01-07 DIAGNOSIS — R7989 Other specified abnormal findings of blood chemistry: Secondary | ICD-10-CM

## 2018-01-07 DIAGNOSIS — K5000 Crohn's disease of small intestine without complications: Secondary | ICD-10-CM | POA: Insufficient documentation

## 2018-01-07 DIAGNOSIS — K648 Other hemorrhoids: Secondary | ICD-10-CM | POA: Diagnosis not present

## 2018-01-07 DIAGNOSIS — D124 Benign neoplasm of descending colon: Secondary | ICD-10-CM | POA: Insufficient documentation

## 2018-01-07 DIAGNOSIS — K529 Noninfective gastroenteritis and colitis, unspecified: Secondary | ICD-10-CM | POA: Insufficient documentation

## 2018-01-07 DIAGNOSIS — K633 Ulcer of intestine: Secondary | ICD-10-CM | POA: Insufficient documentation

## 2018-01-07 DIAGNOSIS — Z1211 Encounter for screening for malignant neoplasm of colon: Secondary | ICD-10-CM | POA: Insufficient documentation

## 2018-01-07 DIAGNOSIS — E669 Obesity, unspecified: Secondary | ICD-10-CM | POA: Insufficient documentation

## 2018-01-07 DIAGNOSIS — K625 Hemorrhage of anus and rectum: Secondary | ICD-10-CM | POA: Insufficient documentation

## 2018-01-07 DIAGNOSIS — D128 Benign neoplasm of rectum: Secondary | ICD-10-CM | POA: Diagnosis not present

## 2018-01-07 HISTORY — PX: COLONOSCOPY WITH PROPOFOL: SHX5780

## 2018-01-07 LAB — FERRITIN: FERRITIN: 480 ng/mL — AB (ref 24–336)

## 2018-01-07 SURGERY — COLONOSCOPY WITH PROPOFOL
Anesthesia: General

## 2018-01-07 MED ORDER — PROPOFOL 10 MG/ML IV BOLUS
INTRAVENOUS | Status: DC | PRN
  Administered 2018-01-07: 100 mg via INTRAVENOUS
  Administered 2018-01-07: 30 mg via INTRAVENOUS

## 2018-01-07 MED ORDER — PROPOFOL 500 MG/50ML IV EMUL
INTRAVENOUS | Status: DC | PRN
  Administered 2018-01-07: 140 ug/kg/min via INTRAVENOUS

## 2018-01-07 MED ORDER — MIDAZOLAM HCL 2 MG/2ML IJ SOLN
INTRAMUSCULAR | Status: AC
  Filled 2018-01-07: qty 2

## 2018-01-07 MED ORDER — MIDAZOLAM HCL 2 MG/2ML IJ SOLN
INTRAMUSCULAR | Status: DC | PRN
  Administered 2018-01-07: 2 mg via INTRAVENOUS

## 2018-01-07 MED ORDER — SODIUM CHLORIDE 0.9 % IV SOLN
INTRAVENOUS | Status: DC
  Administered 2018-01-07: 10:00:00 via INTRAVENOUS

## 2018-01-07 MED ORDER — LIDOCAINE HCL (CARDIAC) PF 100 MG/5ML IV SOSY
PREFILLED_SYRINGE | INTRAVENOUS | Status: DC | PRN
  Administered 2018-01-07: 50 mg via INTRAVENOUS

## 2018-01-07 NOTE — Op Note (Signed)
[facility] Gastroenterology Patient Name: Ricky Mejia Procedure Date: 01/07/2018 11:02 AM MRN: [id number] Account #: [id number] Date of Birth: 08-22-1970 Admit Type: Outpatient Age: 47 Room: [facility] ENDO ROOM 3 Gender: Male Note Status: Finalized Procedure:            Colonoscopy Indications:          High risk colon cancer surveillance: Personal history                        of sessile serrated colon polyp (less than 10 mm in                        size) with no dysplasia, piecemeal polypectomy, Last                        colonoscopy: February 2016 Providers:            Lin Landsman MD, MD Medicines:            Monitored Anesthesia Care Complications:        No immediate complications. Estimated blood loss: None. Procedure:            Pre-Anesthesia Assessment:                       - Prior to the procedure, a History and Physical was                        performed, and patient medications and allergies were                        reviewed. The patient is competent. The risks and                        benefits of the procedure and the sedation options and                        risks were discussed with the patient. All questions                        were answered and informed consent was obtained.                        Patient identification and proposed procedure were                        verified by the physician, the nurse, the                        anesthesiologist, the anesthetist and the technician in                        the pre-procedure area in the procedure room in the                        endoscopy suite. Mental Status Examination: alert and                        oriented. Airway Examination: normal oropharyngeal  airway and neck mobility. Respiratory Examination:                        clear to auscultation. CV Examination: normal.                        Prophylactic Antibiotics: The patient does not  require                        prophylactic antibiotics. Prior Anticoagulants: The                        patient has taken no previous anticoagulant or                        antiplatelet agents. ASA Grade Assessment: II - A                        patient with mild systemic disease. After reviewing the                        risks and benefits, the patient was deemed in                        satisfactory condition to undergo the procedure. The                        anesthesia plan was to use monitored anesthesia care                        (MAC). Immediately prior to administration of                        medications, the patient was re-assessed for adequacy                        to receive sedatives. The heart rate, respiratory rate,                        oxygen saturations, blood pressure, adequacy of                        pulmonary ventilation, and response to care were                        monitored throughout the procedure. The physical status                        of the patient was re-assessed after the procedure.                       After obtaining informed consent, the colonoscope was                        passed under direct vision. Throughout the procedure,                        the patient's blood pressure, pulse, and oxygen  saturations were monitored continuously. The                        Colonoscope was introduced through the anus and                        advanced to the 10 cm into the ileum. The colonoscopy                        was performed without difficulty. The patient tolerated                        the procedure well. The quality of the bowel                        preparation was evaluated using the BBPS [facility] Bowel                        Preparation Scale) with scores of: Right Colon = 3,                        Transverse Colon = 3 and Left Colon = 3 (entire mucosa                        seen well with no residual  staining, small fragments of                        stool or opaque liquid). The total BBPS score equals 9. Findings:      The perianal and digital rectal examinations were normal. Pertinent       negatives include normal sphincter tone and no palpable rectal lesions.      The terminal ileum contained multiple skipped 2-62m ulcers with       intervening normal mucosa. No bleeding was present. No stigmata of       recent bleeding were seen. Biopsies were taken with a cold forceps for       histology.      Three sessile polyps were found in the rectum and descending colon. The       polyps were 4 mm in size. These polyps were removed with a cold snare.       Resection and retrieval were complete.      Non-bleeding internal hemorrhoids were found during retroflexion. The       hemorrhoids were large. Impression:           - Multiple ulcers in the terminal ileum. Biopsied.                       - Three 4 mm polyps in the rectum and in the descending                        colon, removed with a cold snare. Resected and                        retrieved.                       - Non-bleeding internal hemorrhoids. Recommendation:       - Discharge patient to home (with escort).                       -  Low fat diet and low sodium diet.                       - Continue present medications.                       - Await pathology results.                       - Repeat colonoscopy in 3 - 5 years for surveillance                        based on pathology results.                       - Return to my office as previously scheduled. Procedure Code(s):    --- Professional ---                       [phone], Colonoscopy, flexible; with removal of tumor(s),                        polyp(s), or other lesion(s) by snare technique                       45380, 29, Colonoscopy, flexible; with biopsy, single                        or multiple Diagnosis Code(s):    --- Professional ---                        Z86.010, Personal history of colonic polyps                       K63.3, Ulcer of intestine                       K62.1, Rectal polyp                       D12.4, Benign neoplasm of descending colon                       K64.8, Other hemorrhoids CPT copyright 2017 American Medical Association. All rights reserved. The codes documented in this report are preliminary and upon coder review may  be revised to meet current compliance requirements. Dr. Ulyess Mort Lin Landsman MD, MD 01/07/2018 11:38:47 AM This report has been signed electronically. Number of Addenda: 0 Note Initiated On: 01/07/2018 11:02 AM Scope Withdrawal Time: 0 hours 22 minutes 22 seconds  Total Procedure Duration: 0 hours 24 minutes 21 seconds       [facility]

## 2018-01-07 NOTE — Anesthesia Post-op Follow-up Note (Signed)
Anesthesia QCDR form completed.        

## 2018-01-07 NOTE — Transfer of Care (Signed)
Immediate Anesthesia Transfer of Care Note  Patient: Ricky Mejia  Procedure(s) Performed: COLONOSCOPY WITH PROPOFOL (N/A )  Patient Location: PACU and Endoscopy Unit  Anesthesia Type:General  Level of Consciousness: drowsy  Airway & Oxygen Therapy: Patient Spontanous Breathing  Post-op Assessment: Report given to RN and Post -op Vital signs reviewed and stable  Post vital signs: Reviewed and stable  Last Vitals:  Vitals Value Taken Time  BP 86/56 01/07/2018 11:37 AM  Temp    Pulse 87 01/07/2018 11:37 AM  Resp 15 01/07/2018 11:37 AM  SpO2 93 % 01/07/2018 11:37 AM  Vitals shown include unvalidated device data.  Last Pain:  Vitals:   01/07/18 0940  TempSrc: Tympanic  PainSc: 0-No pain         Complications: No apparent anesthesia complications

## 2018-01-07 NOTE — H&P (Signed)
Cephas Darby, MD [address]  [place]  [place], [place] [address]  Main: [phone]  Fax: [phone] Pager: [phone]  Primary Care Physician:  Guadalupe Maple, MD Primary Gastroenterologist:  Dr. Cephas Darby  Pre-Procedure History & Physical: HPI:  Ricky Mejia is a 47 y.o. male is here for an colonoscopy.   Past Medical History:  Diagnosis Date  . Abnormal LFTs   . Allergic rhinitis   . Diverticulitis   . 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 Hyperplastic colon polyp   . Obesity   . Serrated adenoma of colon   . 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 COLONOSCOPY    . SIGMOIDOSCOPY    . WISDOM TOOTH EXTRACTION      Prior to Admission medications   Medication Sig Start Date End Date Taking? Authorizing Provider  levocetirizine (XYZAL) 5 MG tablet TAKE 1 TABLET (5 MG TOTAL) BY MOUTH EVERY EVENING. 05/08/14   [provider]  Multiple Vitamin (MULTIVITAMIN) tablet Take 1 tablet by mouth daily.    [provider]    Allergies as of 12/29/2017 - Review Complete 12/29/2017  Allergen Reaction Noted  . Flonase [fluticasone propionate] Other (See Comments) 07/22/2017  . Sulfa antibiotics Rash 09/27/2013    Family History  Problem Relation Age of Onset  . Lupus Mother   . Lung cancer Father   . Diabetes Mellitus I Father   . Osteoarthritis Maternal Grandmother   . Osteoarthritis Maternal Grandfather   . Colon cancer Maternal Grandfather   . Osteoarthritis Paternal Grandmother   . Osteoarthritis Paternal Grandfather     Social History   Socioeconomic History  . Marital status: Divorced    Spouse name: Not on file  . Number of children: Not on file  . Years of education: Not on file  . Highest education level: Not on file  Occupational History  . Not on file  Social Needs  . Financial resource strain: Not on file  . Food insecurity:    Worry: Not on file    Inability: Not  on file  . Transportation needs:    Medical: Not on file    Non-medical: Not on file  Tobacco Use  . Smoking status: Never Smoker  . Smokeless tobacco: Current User    Types: Chew  Substance and Sexual Activity  . Alcohol use: No  . Drug use: No  . Sexual activity: Not on file  Lifestyle  . Physical activity:    Days per week: Not on file    Minutes per session: Not on file  . Stress: Not on file  Relationships  . Social connections:    Talks on phone: Not on file    Gets together: Not on file    Attends religious service: Not on file    Active member of club or organization: Not on file    Attends meetings of clubs or organizations: Not on file    Relationship status: Not on file  . Intimate partner violence:    Fear of current or ex partner: Not on file    Emotionally abused: Not on file    Physically abused: Not on file    Forced sexual activity: Not on file  Other Topics Concern  . Not on file  Social History Narrative  . Not on file    Review of Systems: See HPI, otherwise negative ROS  Physical Exam: BP Marland Kitchen)  120/93   Pulse 73   Temp 97.7 F (36.5 C) (Tympanic)   Resp 20   Ht 6' 1"  (1.854 m)   Wt 122.9 kg   SpO2 100%   BMI 35.75 kg/m  General:   Alert,  pleasant and cooperative in NAD Head:  Normocephalic and atraumatic. Neck:  Supple; no masses or thyromegaly. Lungs:  Clear throughout to auscultation.    Heart:  Regular rate and rhythm. Abdomen:  Soft, nontender and nondistended. Normal bowel sounds, without guarding, and without rebound.   Neurologic:  Alert and  oriented x4;  grossly normal neurologically.  Impression/Plan: Ricky Mejia is here for an colonoscopy to be performed for h/o colon polyps  Risks, benefits, limitations, and alternatives regarding  colonoscopy have been reviewed with the patient.  Questions have been answered.  All parties agreeable.   Sherri Sear, MD  01/07/2018, 10:53 AM

## 2018-01-07 NOTE — Anesthesia Postprocedure Evaluation (Signed)
Anesthesia Post Note  Patient: Ricky Mejia  Procedure(s) Performed: COLONOSCOPY WITH PROPOFOL (N/A )  Patient location during evaluation: Endoscopy Anesthesia Type: General Level of consciousness: awake and alert Pain management: pain level controlled Vital Signs Assessment: post-procedure vital signs reviewed and stable Respiratory status: spontaneous breathing, nonlabored ventilation, respiratory function stable and patient connected to nasal cannula oxygen Cardiovascular status: blood pressure returned to baseline and stable Postop Assessment: no apparent nausea or vomiting Anesthetic complications: no     Last Vitals:  Vitals:   01/07/18 1145 01/07/18 1155  BP: (!) 96/57 102/68  Pulse:    Resp:    Temp:    SpO2: 93%     Last Pain:  Vitals:   01/07/18 1205  TempSrc:   PainSc: 0-No pain                 Martha Clan

## 2018-01-07 NOTE — Anesthesia Preprocedure Evaluation (Signed)
Anesthesia Evaluation  Patient identified by MRN, date of birth, ID band Patient awake    Reviewed: Allergy & Precautions, H&P , NPO status , Patient's Chart, lab work & pertinent test results, reviewed documented beta blocker date and time   History of Anesthesia Complications Negative for: history of anesthetic complications  Airway Mallampati: III  TM Distance: >3 FB Neck ROM: full    Dental  (+) Dental Advidsory Given, Poor Dentition   Pulmonary neg pulmonary ROS,           Cardiovascular Exercise Tolerance: Good negative cardio ROS       Neuro/Psych negative neurological ROS  negative psych ROS   GI/Hepatic negative GI ROS, Neg liver ROS,   Endo/Other  negative endocrine ROS  Renal/GU Renal disease  negative genitourinary   Musculoskeletal   Abdominal   Peds  Hematology  (+) Blood dyscrasia, anemia ,   Anesthesia Other Findings Past Medical History: No date: Abnormal LFTs No date: Allergic rhinitis No date: Diverticulitis No date: HUS (hemolytic uremic syndrome) (HCC) No date: Hyperplastic colon polyp No date: Obesity No date: 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[place])   Reproductive/Obstetrics negative OB ROS                             Anesthesia Physical Anesthesia Plan  ASA: II  Anesthesia Plan: General   Post-op Pain Management:    Induction: Intravenous  PONV Risk Score and Plan: 2 and Propofol infusion and TIVA  Airway Management Planned: Natural Airway and Nasal Cannula  Additional Equipment:   Intra-op Plan:   Post-operative Plan:   Informed Consent: I have reviewed the patients History and Physical, chart, labs and discussed the procedure including the risks, benefits and alternatives for the proposed anesthesia with the patient or authorized representative who has indicated his/her understanding and acceptance.   Dental Advisory  Given  Plan Discussed with: Anesthesiologist, CRNA and Surgeon  Anesthesia Plan Comments:         Anesthesia Quick Evaluation

## 2018-01-11 ENCOUNTER — Encounter: Payer: Self-pay | Admitting: Gastroenterology

## 2018-01-11 LAB — SURGICAL PATHOLOGY

## 2018-01-12 LAB — HEMOCHROMATOSIS DNA-PCR(C282Y,H63D)

## 2018-01-13 ENCOUNTER — Encounter: Payer: Self-pay | Admitting: Gastroenterology

## 2018-01-15 ENCOUNTER — Other Ambulatory Visit: Payer: Self-pay

## 2018-01-20 ENCOUNTER — Telehealth: Payer: Self-pay | Admitting: Gastroenterology

## 2018-01-20 NOTE — Telephone Encounter (Signed)
Ricky Mejia from [facility] scheduling left vm to inform us that pt is scheduled for Liver Biopsy on 01/28/18 at 10:00 am at [facility] entrance

## 2018-01-20 NOTE — Telephone Encounter (Signed)
Pt has been notified.

## 2018-01-27 ENCOUNTER — Telehealth: Payer: Self-pay | Admitting: Gastroenterology

## 2018-01-27 NOTE — Telephone Encounter (Signed)
Spoke with pt and he wants to cancel liver bx and wait until his office visit with Dr. Marius Ditch before he reschedules appt

## 2018-01-27 NOTE — Telephone Encounter (Signed)
Judeen Hammans call stating the patient called her and was saying he had an appointment with them. He cancelled the appointment but it was with another provider in their office not her doctor. She continued to talk with him and he stated he was unaware of his liver bx scheduled for 01-28-18 clearly noted by [facility] on 01-15-18 patient aware. Temeka was given a written note about this.

## 2018-01-27 NOTE — Telephone Encounter (Signed)
Pt left vm he wants to cancel the Biopsy for tomorrow but he will first like to talk to you about it please call pt

## 2018-01-28 ENCOUNTER — Ambulatory Visit: Admission: RE | Admit: 2018-01-28 | Payer: BLUE CROSS/BLUE SHIELD | Source: Ambulatory Visit

## 2018-01-29 ENCOUNTER — Encounter: Payer: Self-pay | Admitting: Hematology and Oncology

## 2018-02-09 ENCOUNTER — Encounter: Payer: Self-pay | Admitting: Gastroenterology

## 2018-02-09 ENCOUNTER — Ambulatory Visit: Payer: BLUE CROSS/BLUE SHIELD | Admitting: Gastroenterology

## 2018-02-09 NOTE — Patient Instructions (Signed)
Hemochromatosis Hemochromatosis, also called iron storage disease, is a condition in which the body stores too much iron. The excess iron builds up in your joints, heart, liver, pancreas, and other organs and damages them. What are the causes? Hemochromatosis may be caused by:  Abnormal genes. These are passed down (inherited) from both of your parents.  Having blood transfusions.  Having too much iron in your diet.  What increases the risk?  Being Caucasian.  Inheriting abnormal genes from both your parents.  Having a severe or long-term loss of red blood cells (anemia). What are the signs or symptoms? Signs and symptoms can start at any age, but usually start in middle age. They may include:  Fatigue.  Weakness.  Joint pain.  Abdominal pain.  Weight loss.  Pearline Cables or bronze skin coloring.  Loss of interest in sex.  Loss of menstrual periods in women.  Loss of body hair.  Shortness of breath.  Late signs of hemochromatosis include damage to the liver, heart, or pancreas. This may lead to:  Liver cancer.  Abnormal heart rhythms.  Heart failure.  Diabetes.  How is this diagnosed? Your health care provider will perform a physical exam and ask about your symptoms and family history. Tests may be done to confirm the diagnosis. Blood tests may include:  Transferrin saturation. This test measures how much iron is bound to hemoglobin in your blood. Hemoglobin is a substance in red blood cells that carries oxygen to the tissues of the body.  Serum ferritin. This test measures a protein that stores iron in your blood.  A test to check for the abnormal genes that cause the condition.  You may have a tissue sample taken from your liver with a needle (biopsy) for testing. The results will show if iron is building up in your liver. How is this treated? Hemochromatosis is treated by removing iron by taking blood (phlebotomy). Having a phlebotomy is similar to having blood  taken for donation. The procedure is simple and effective as long as it is started before organ damage develops. When you begin treatment, you may have a pint of blood removed once or twice a week. You may have blood tests done to determine when your iron levels return to normal. Once your iron levels are normal, you may only need to have a phlebotomy every few months. Follow these instructions at home:  Do not eat a lot of foods that are high in iron. Iron-rich foods include red meats and organ meats.  Do not take dietary supplements that contain iron.  Do not take vitamin C supplements. Vitamin C increases iron absorption.  Do not eat raw shellfish or raw fish. Hemochromatosis may increase your chance of infection from these foods.  If you have liver damage, do not drink any alcohol.  If you do not have liver damage, limit alcohol intake to no more than 1 drink per day for nonpregnant women and 2 drinks per day for men. One drink equals 12 ounces of beer, 5 ounces of wine, or 1 ounces of hard liquor.  Try to exercise for at least 30 minutes on most days of the week.  Keep all follow-up visits as directed by your health care provider. This is important. Contact a health care provider if:  You have fatigue.  You have weakness.  You have joint pain.  You have abdominal pain.  You experience weight loss.  You have shortness of breath. Get help right away if:  You have  chest pain.  You have trouble breathing. This information is not intended to replace advice given to you by your health care provider. Make sure you discuss any questions you have with your health care provider. Document Released: 03/28/2000 Document Revised: 09/06/2015 Document Reviewed: 05/25/2013 Elsevier Interactive Patient Education  2018 [place] to eat when you have hemochromatosis Fruits and vegetables With hemochromatosis, excess iron increases oxidative stress and free radical  activity, which can damage your DNA. Antioxidants play an important role in protecting your body from the damage caused by oxidative stress. Fruits and vegetables are a great source of many antioxidants, such as vitamin E, vitamin C, and flavonoids. Many of the recommendations for hemochromatosis will warn you to stay away from vegetables high in iron. This may not always be necessary. Vegetables that are high in iron, such as spinach and other leafy greens, contain only nonheme iron. Nonheme iron is less easily absorbed than heme iron, making vegetables a good choice. Talk to your doctor or dietitian if you have concerns. Grains and legumes Grains and legumes contain substances that inhibit iron absorption - specifically, phytic acid. For many people, a diet high in grains may place them at risk for mineral deficiencies, such as calcium, iron, or zinc. However, for people with hemochromatosis, this phytic acid can help to keep the body from overabsorbing iron from foods. Eggs Eggs are a source of nonheme iron, so are they fine to eat on a hemochromatosis diet? Actually, the answer is yes - due to a phosphoprotein in the egg yolk called phosvitin. Research has shown that phosvitin may inhibit the absorption of iron, among other minerals. In one animal studyTrusted Source, researchers found that rats fed with a yolk protein had lower iron absorption than rats given soy or casein protein. Tea and coffee Both tea and coffee contain polyphenolic substances called tannins, also known as tannic acid. The tannins in tea and coffee inhibit iron absorption. This makes these two popular beverages a great addition to your diet if you have hemochromatosis. Lean protein Protein is an important part of a healthy diet. Many dietary sources of protein do contain iron. However, this doesn't mean that you have to cut meat out of your diet completely. Instead, plan your meals around protein sources that are lower in  iron, such as [place], chicken, tuna, and even deli meat.  Foods to avoid when you have hemochromatosis Excess red meat Red meat can be a healthy part of a well-rounded diet if eaten in moderation. The same may be said for those with hemochromatosis. Red meat is a source of heme iron, meaning that the iron is more easily able to be absorbed by the body. If you continue to eat red meat, consider eating only two to three servings per week. You can pair it with foods that decrease the absorption of iron. Raw seafood Although seafood itself doesn't contain a dangerous amount of iron, there's something in raw shellfish that might be more concerning. Vibrio vulnificus is a type of bacteria present in coastal waters and can infect the shellfish in these areas. Older research has suggested that iron plays an integral role in the spread of V. vulnificus. For people with high levels of iron, such as those with hemochromatosis, it's important to avoid raw shellfish. Foods rich in vitamins A and C Vitamin C, or ascorbic acid, is one of the most effective enhancers of iron absorption. Although vitamin C is a necessary part of a  healthy diet, you may want to be aware of vitamin C-rich foods and eat them in moderation. In addition, vitamin A has also been shown to increase the absorption of iron in human studies. Note that many leafy green vegetables contain vitamin C, vitamin A, and iron. However, since nonheme iron present in vegetables isn't as easily absorbed, the benefits seem to outweigh the risks. Fortified foods Fortified foods have been fortified with nutrients. Many fortified foods contain high amounts of vitamins and minerals such as calcium, zinc, and iron. If you have hemochromatosis, eating iron-rich fortified foods may increase your blood iron levels. Check the iron content on nutrition labels before you eat these types of foods. Excess alcohol Alcohol consumption, especially chronic alcohol  consumption, can damage the liver. Iron overload in hemochromatosis can also cause or worsen liver damage, so alcohol should only be consumed moderately. If you have any type of liver condition due to hemochromatosis, you shouldn't consume alcohol at all, as this could further damage your liver. Supplements There aren't many recommendations for additional supplements when you have hemochromatosis. This is because research is limited on dietary interventions for this condition. Still, you should avoid or be careful with the following supplements: Iron. As you can imagine, taking iron when you have hemochromatosis can place you at risk for extremely high levels of iron in the body.  Vitamin C. Although vitamin C is a popular supplement for iron-deficiency anemia, it should be avoided in those with hemochromatosis. You can consume your daily recommended value of vitamin C through whole fruits and vegetables instead.  Multivitamins. If you have hemochromatosis, you should take multivitamin or multimineral supplements with caution. They may contain high amounts of iron, vitamin C, and other nutrients that enhance iron absorption. Always check the label and consult with your doctor.

## 2018-02-09 NOTE — Progress Notes (Signed)
Cephas Darby, MD [address]  [place], [place] [address]  Main: [phone]  Fax: [phone]    Gastroenterology Consultation  Referring Provider:     Guadalupe Maple, MD Primary Care Physician:  Guadalupe Maple, MD Primary Gastroenterologist:  Dr. Cephas Darby Reason for Consultation:     Rectal bleeding, history of colon polyps, elevated LFTs        HPI:   Ricky Mejia is a 47 y.o. male referred by Dr. Guadalupe Maple, MD  for consultation & management of rectal bleeding and elevated LFTs  Rectal bleeding:Patient reports having one episode of bleeding per rectum every month ago which was self-limited. He experiences intermittent bleeding per rectum. He had a colonoscopy in 2016 and was found to have sessile serrated adenoma in cecum which was removed in piecemeal. The procedure report is not available with me today. He denies any other GI symptoms  Elevated LFTs: His found to have elevated transaminases, about twice upper limit of normal since 2017. On imaging, he was found to have diffuse hepatic steatosis in 2017. His hemoglobin is on the upper limit of normal between 16-18g/dl. He does not demonstrate any symptoms of chronic liver disease. 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 He also underwent colonoscopy which revealed subcentimeter tubular adenomas, removed.  I recommended liver biopsy and hematology referral.  However, patient canceled liver biopsy and wanted to discuss with me in office prior to proceeding with it.  He denies any complaints today  NSAIDs: none  Antiplts/Anticoagulants/Anti thrombotics: none Patient denies any known family history of GI malignancy Father with cancer,  unknown type Father with hereditary hemochromatosis, receives phlebotomies periodically He denies any GI surgeries  GI Procedures:  Colonoscopy 01/07/2018 - Multiple ulcers in the terminal ileum. Biopsied. - Three 4 mm polyps in the rectum and in the descending colon, removed with a cold snare. Resected and Retrieved. - Nonbleeding internal hemorrhoids  DIAGNOSIS:  A. TERMINAL ILEUM ULCER; COLD BIOPSY:  - SMALL INTESTINAL MUCOSA WITH FOCAL REACTIVE LYMPHOID AGGREGATE AND  ADJACENT MILD ACTIVE ENTERITIS.  - NEGATIVE FOR VIRAL CYTOPATHIC EFFECT, ISCHEMIC CHANGE, DYSPLASIA, AND  MALIGNANCY.  - DEEPER SECTIONS EXAMINED.   B. TERMINAL ILEUM; COLD BIOPSY:  - UNREMARKABLE SMALL INTESTINAL MUCOSA.  - DEEPER SECTIONS EXAMINED.   C. COLON POLYP, DESCENDING; COLD SNARE:  - TUBULAR ADENOMA.  - NEGATIVE FOR HIGH-GRADE DYSPLASIA AND MALIGNANCY.   D. RECTUM POLYP X2; COLD SNARE:  - HYPERPLASTIC POLYP (2).  - NEGATIVE FOR DYSPLASIAAND MALIGNANCY.,   colonoscopy 2016 DIAGNOSIS:  A. COLON, CECAL POLYP; MUCOSAL BIOPSIES:  - FRAGMENTS OF SESSILE SERRATED ADENOMA.  - NO HIGH GRADE DYSPLASIA OR MALIGNANCY SEEN.   B. COLON, RANDOM; MUCOSAL BIOPSIES:  - WITHIN NORMAL LIMITS.  - NO FEATURES OF MICROSCOPIC/LYMPHOCYTIC OR COLLAGENOUS COLITIS  SEEN.  Colonoscopy 05/09/2010 Diagnosis:  CECUM POLYP COLD SNARE:  - ONE 0.1 CM FRAGMENT CONSISTENT WITH HYPERPLASTIC POLYP, ADMIXED  WITH FECAL MATERIAL.    Past Medical History:  Diagnosis Date  . Abnormal LFTs   . Allergic rhinitis   . Diverticulitis   . HUS (hemolytic uremic syndrome) ([place])   . Hyperplastic colon polyp   . Obesity   . Serrated adenoma of colon   . T.T.P. syndrome ([place])  Past Surgical History:  Procedure Laterality Date  . COLONOSCOPY    . COLONOSCOPY WITH PROPOFOL N/A 01/07/2018   Procedure: COLONOSCOPY WITH PROPOFOL;  Surgeon: Lin Landsman, MD;  Location: [facility] END[facility];  Service: Gastroenterology;   Laterality: N/A;  . SIGMOIDOSCOPY    . WISDOM TOOTH EXTRACTION      Current Outpatient Medications:  .  levocetirizine (XYZAL) 5 MG tablet, TAKE 1 TABLET (5 MG TOTAL) BY MOUTH EVERY EVENING., Disp: , Rfl:  .  Multiple Vitamin (MULTIVITAMIN) tablet, Take 1 tablet by mouth daily., Disp: , Rfl:     Family History  Problem Relation Age of Onset  . Lupus Mother   . Lung cancer Father   . Diabetes Mellitus I Father   . Osteoarthritis Maternal Grandmother   . Osteoarthritis Maternal Grandfather   . Colon cancer Maternal Grandfather   . Osteoarthritis Paternal Grandmother   . Osteoarthritis Paternal Grandfather      Social History   Tobacco Use  . Smoking status: Never Smoker  . Smokeless tobacco: Current User    Types: Chew  Substance Use Topics  . Alcohol use: No  . Drug use: No    Allergies as of 02/09/2018 - Review Complete 02/09/2018  Allergen Reaction Noted  . Flonase [fluticasone propionate] Other (See Comments) 07/22/2017  . Sulfa antibiotics Rash 09/27/2013    Review of Systems:    All systems reviewed and negative except where noted in HPI.   Physical Exam:  BP (!) 146/100 (BP Location: Left Arm, Patient Position: Sitting, Cuff Size: Large)   Pulse 96   Resp 18   Ht 6' 1"  (1.854 m)   Wt 277 lb (125.6 kg)   BMI 36.55 kg/m  No LMP for male patient.  General:   Alert,  Well-developed, well-nourished, pleasant and cooperative in NAD Head:  Normocephalic and atraumatic. Eyes:  Sclera clear, no icterus.   Conjunctiva pink. Ears:  Normal auditory acuity. Nose:  No deformity, discharge, or lesions. Mouth:  No deformity or lesions,oropharynx pink & moist. Neck:  Supple; no masses or thyromegaly. Lungs:  Respirations even and unlabored.  Clear throughout to auscultation.   No wheezes, crackles, or rhonchi. No acute distress. Heart:  Regular rate and rhythm; no murmurs, clicks, rubs, or gallops. Abdomen:  Normal bowel sounds. Soft, obese,non-tender and  non-distended without masses, hepatosplenomegaly or hernias noted.  No guarding or rebound tenderness.   Rectal: Not performed Msk:  Symmetrical without gross deformities. Good, equal movement & strength bilaterally. Pulses:  Normal pulses noted. Extremities:  No clubbing or edema.  No cyanosis. Neurologic:  Alert and oriented x3;  grossly normal neurologically. Skin:  Intact without significant lesions or rashes. No jaundice. Lymph Nodes:  No significant cervical adenopathy. Psych:  Alert and cooperative. Normal mood and affect.  Imaging Studies: reviewed  Assessment and Plan:   Ricky Mejia is a 47 y.o. Caucasian male with obesity, BMI 35, history of colon adenoma, seen in consultation for rectal bleeding and chronically elevated LFTs  Elevated LFTs: Combination of fatty liver, iron overload  Performed secondary liver disease workup, elevated ferritin and iron saturation as well as single mutation carrier for [facility] gene C282Y I discussed with him about pursuing liver biopsy and referral to hematology for therapeutic phlebotomy Goal ferritin below 100 and iron saturation less than 25 Discussed with him about healthy lifestyle, weight loss and exercise for management of fatty liver disease  Tubular adenomas of the colon Recommend repeat colonoscopy in 5 years  Follow up  in 3 months   Cephas Darby, MD

## 2018-02-15 ENCOUNTER — Other Ambulatory Visit: Payer: Self-pay | Admitting: Radiology

## 2018-02-16 ENCOUNTER — Ambulatory Visit
Admission: RE | Admit: 2018-02-16 | Discharge: 2018-02-16 | Disposition: A | Discharge status: Home / Self Care | Payer: BLUE CROSS/BLUE SHIELD | Source: Ambulatory Visit | Attending: Gastroenterology | Admitting: Gastroenterology

## 2018-02-16 ENCOUNTER — Other Ambulatory Visit: Payer: Self-pay

## 2018-02-16 ENCOUNTER — Inpatient Hospital Stay: Payer: BLUE CROSS/BLUE SHIELD | Admitting: Hematology and Oncology

## 2018-02-16 DIAGNOSIS — Z833 Family history of diabetes mellitus: Secondary | ICD-10-CM | POA: Diagnosis not present

## 2018-02-16 DIAGNOSIS — E669 Obesity, unspecified: Secondary | ICD-10-CM | POA: Diagnosis not present

## 2018-02-16 DIAGNOSIS — Z8349 Family history of other endocrine, nutritional and metabolic diseases: Secondary | ICD-10-CM | POA: Diagnosis not present

## 2018-02-16 DIAGNOSIS — Z882 Allergy status to sulfonamides status: Secondary | ICD-10-CM | POA: Insufficient documentation

## 2018-02-16 DIAGNOSIS — R740 Nonspecific elevation of levels of transaminase and lactic acid dehydrogenase [LDH]: Secondary | ICD-10-CM | POA: Insufficient documentation

## 2018-02-16 DIAGNOSIS — E8889 Other specified metabolic disorders: Secondary | ICD-10-CM | POA: Diagnosis not present

## 2018-02-16 DIAGNOSIS — Z79899 Other long term (current) drug therapy: Secondary | ICD-10-CM | POA: Diagnosis not present

## 2018-02-16 DIAGNOSIS — K760 Fatty (change of) liver, not elsewhere classified: Secondary | ICD-10-CM | POA: Diagnosis not present

## 2018-02-16 LAB — CBC
HCT: 46.6 % (ref 39.0–52.0)
HEMOGLOBIN: 16.8 g/dL (ref 13.0–17.0)
MCH: 34.1 pg — ABNORMAL HIGH (ref 26.0–34.0)
MCHC: 36.1 g/dL — ABNORMAL HIGH (ref 30.0–36.0)
MCV: 94.7 fL (ref 80.0–100.0)
NRBC: 0 % (ref 0.0–0.2)
Platelets: 200 10*3/uL (ref 150–400)
RBC: 4.92 MIL/uL (ref 4.22–5.81)
RDW: 12.7 % (ref 11.5–15.5)
WBC: 6.5 10*3/uL (ref 4.0–10.5)

## 2018-02-16 LAB — PROTIME-INR
INR: 0.85
PROTHROMBIN TIME: 11.6 s (ref 11.4–15.2)

## 2018-02-16 MED ORDER — FENTANYL CITRATE (PF) 100 MCG/2ML IJ SOLN
INTRAMUSCULAR | Status: AC | PRN
  Administered 2018-02-16 (×2): 50 ug via INTRAVENOUS

## 2018-02-16 MED ORDER — FENTANYL CITRATE (PF) 100 MCG/2ML IJ SOLN
INTRAMUSCULAR | Status: AC
  Filled 2018-02-16: qty 2

## 2018-02-16 MED ORDER — SODIUM CHLORIDE 0.9 % IV SOLN
INTRAVENOUS | Status: DC
  Administered 2018-02-16: 09:00:00 via INTRAVENOUS

## 2018-02-16 MED ORDER — MIDAZOLAM HCL 2 MG/2ML IJ SOLN
INTRAMUSCULAR | Status: AC | PRN
  Administered 2018-02-16 (×2): 1 mg via INTRAVENOUS

## 2018-02-16 MED ORDER — SODIUM CHLORIDE 0.9 % IV SOLN: INTRAVENOUS | Status: DC

## 2018-02-16 MED ORDER — MIDAZOLAM HCL 5 MG/5ML IJ SOLN
INTRAMUSCULAR | Status: AC
  Filled 2018-02-16: qty 5

## 2018-02-16 NOTE — Discharge Instructions (Signed)
Moderate Conscious Sedation, Adult, Care After °These instructions provide you with information about caring for yourself after your procedure. Your health care provider may also give you more specific instructions. Your treatment has been planned according to current medical practices, but problems sometimes occur. Call your health care provider if you have any problems or questions after your procedure. °What can I expect after the procedure? °After your procedure, it is common: °· To feel sleepy for several hours. °· To feel clumsy and have poor balance for several hours. °· To have poor judgment for several hours. °· To vomit if you eat too soon. ° °Follow these instructions at home: °For at least 24 hours after the procedure: ° °· Do not: °? Participate in activities where you could fall or become injured. °? Drive. °? Use heavy machinery. °? Drink alcohol. °? Take sleeping pills or medicines that cause drowsiness. °? Make important decisions or sign legal documents. °? Take care of children on your own. °· Rest. °Eating and drinking °· Follow the diet recommended by your health care provider. °· If you vomit: °? Drink water, juice, or soup when you can drink without vomiting. °? Make sure you have little or no nausea before eating solid foods. °General instructions °· Have a responsible adult stay with you until you are awake and alert. °· Take over-the-counter and prescription medicines only as told by your health care provider. °· If you smoke, do not smoke without supervision. °· Keep all follow-up visits as told by your health care provider. This is important. °Contact a health care provider if: °· You keep feeling nauseous or you keep vomiting. °· You feel light-headed. °· You develop a rash. °· You have a fever. °Get help right away if: °· You have trouble breathing. °This information is not intended to replace advice given to you by your health care provider. Make sure you discuss any questions you have  with your health care provider. °Document Released: 01/19/2013 Document Revised: 09/03/2015 Document Reviewed: 07/21/2015 °Elsevier Interactive Patient Education © 2018 Elsevier Inc. ° ° °Liver Biopsy, Care After °These instructions give you information on caring for yourself after your procedure. Your doctor may also give you more specific instructions. Call your doctor if you have any problems or questions after your procedure. °Follow these instructions at home: °· Rest at home for 1-2 days or as told by your doctor. °· Have someone stay with you for at least 24 hours. °· Do not do these things in the first 24 hours: °? Drive. °? Use machinery. °? Take care of other people. °? Sign legal documents. °? Take a bath or shower. °· There are many different ways to close and cover a cut (incision). For example, a cut can be closed with stitches, skin glue, or adhesive strips. Follow your doctor's instructions on: °? Taking care of your cut. °? Changing and removing your bandage (dressing). °? Removing whatever was used to close your cut. °· Do not drink alcohol in the first week. °· Do not lift more than 5 pounds or play contact sports for the first 2 weeks. °· Take medicines only as told by your doctor. For 1 week, do not take medicine that has aspirin in it or medicines like ibuprofen. °· Get your test results. °Contact a doctor if: °· A cut bleeds and leaves more than just a small spot of blood. °· A cut is red, puffs up (swells), or hurts more than before. °· Fluid or something else   comes from a cut. °· A cut smells bad. °· You have a fever or chills. °Get help right away if: °· You have swelling, bloating, or pain in your belly (abdomen). °· You get dizzy or faint. °· You have a rash. °· You feel sick to your stomach (nauseous) or throw up (vomit). °· You have trouble breathing, feel short of breath, or feel faint. °· Your chest hurts. °· You have problems talking or seeing. °· You have trouble balancing or moving  your arms or legs. °This information is not intended to replace advice given to you by your health care provider. Make sure you discuss any questions you have with your health care provider. °Document Released: 01/08/2008 Document Revised: 09/06/2015 Document Reviewed: 05/27/2013 °Elsevier Interactive Patient Education © 2018 Elsevier Inc. ° ° °

## 2018-02-16 NOTE — H&P (Signed)
Chief Complaint: Patient was seen in consultation today for liver biopsy at the request of Lin Landsman  Referring Physician(s): Lin Landsman  Patient Status: [facility] - Out-pt  History of Present Illness: Ricky Mejia is a 47 y.o. male with a history of elevated liver transaminases, steatosis by imaging and lab evidence of hereditary hemochromatosis. Now referred for random liver biopsy for further evaluation. Has had URI recently, but no significant fever. Some cough, but no purulent sputum.  Past Medical History:  Diagnosis Date  . Abnormal LFTs   . Allergic rhinitis   . Diverticulitis   . 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 Hyperplastic colon polyp   . Obesity   . Serrated adenoma of colon   . 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 COLONOSCOPY    . COLONOSCOPY WITH PROPOFOL N/A 01/07/2018   Procedure: COLONOSCOPY WITH PROPOFOL;  Surgeon: Lin Landsman, MD;  Location: [facility] ENDOSCOPY;  Service: Gastroenterology;  Laterality: N/A;  . SIGMOIDOSCOPY    . WISDOM TOOTH EXTRACTION      Allergies: Flonase [fluticasone propionate] and Sulfa antibiotics  Medications: Prior to Admission medications   Medication Sig Start Date End Date Taking? Authorizing Provider  Multiple Vitamin (MULTIVITAMIN) tablet Take 1 tablet by mouth daily.   Yes [provider]  levocetirizine (XYZAL) 5 MG tablet TAKE 1 TABLET (5 MG TOTAL) BY MOUTH EVERY EVENING. 05/08/14   [provider]     Family History  Problem Relation Age of Onset  . Lupus Mother   . Lung cancer Father   . Diabetes Mellitus I Father   . Osteoarthritis Maternal Grandmother   . Osteoarthritis Maternal Grandfather   . Colon cancer Maternal Grandfather   . Osteoarthritis Paternal Grandmother   . Osteoarthritis Paternal Grandfather     Social History   Socioeconomic History  . Marital status: Divorced    Spouse name: Not on file  .  Number of children: Not on file  . Years of education: Not on file  . Highest education level: Not on file  Occupational History  . Not on file  Social Needs  . Financial resource strain: Not on file  . Food insecurity:    Worry: Not on file    Inability: Not on file  . Transportation needs:    Medical: Not on file    Non-medical: Not on file  Tobacco Use  . Smoking status: Never Smoker  . Smokeless tobacco: Current User    Types: Chew  Substance and Sexual Activity  . Alcohol use: No  . Drug use: No  . Sexual activity: Not on file  Lifestyle  . Physical activity:    Days per week: Not on file    Minutes per session: Not on file  . Stress: Not on file  Relationships  . Social connections:    Talks on phone: Not on file    Gets together: Not on file    Attends religious service: Not on file    Active member of club or organization: Not on file    Attends meetings of clubs or organizations: Not on file    Relationship status: Not on file  Other Topics Concern  . Not on file  Social History Narrative  . Not on file     Review of Systems: A 12 point ROS discussed and pertinent positives are indicated in the HPI above.  All other systems are negative.  Review of Systems  Constitutional: Negative.   HENT: Positive for congestion.   Respiratory: Positive for cough.   Cardiovascular: Negative.   Gastrointestinal: Negative.   Genitourinary: Negative.   Musculoskeletal: Negative.   Neurological: Negative.     Vital Signs: BP (!) 126/98   Pulse 99   Temp 99.2 F (37.3 C) (Oral)   Resp 15   SpO2 96%   Physical Exam  Constitutional: He is oriented to person, place, and time. He appears well-developed and well-nourished. No distress.  HENT:  Head: Normocephalic and atraumatic.  Neck: Neck supple. No JVD present. No thyromegaly present.  Cardiovascular: Normal rate, regular rhythm and normal heart sounds. Exam reveals no gallop and no friction rub.  No murmur  heard. Pulmonary/Chest: Effort normal and breath sounds normal. No stridor. No respiratory distress. He has no wheezes. He has no rales.  Abdominal: Soft. Bowel sounds are normal. He exhibits no distension and no mass. There is no tenderness. There is no rebound and no guarding.  Musculoskeletal: He exhibits no edema.  Neurological: He is alert and oriented to person, place, and time.  Skin: Skin is warm and dry. No rash noted. He is not diaphoretic.  Vitals reviewed.   Imaging: No results found.  Labs:  CBC: Recent Labs    11/03/17 0931 11/30/17 1429  WBC 6.3 9.7  HGB 17.0 18.1*  HCT 48.0 50.7  PLT 234 244    COAGS: No results for input(s): INR, APTT in the last 8760 hours.  BMP: Recent Labs    11/03/17 0931 11/30/17 1429  NA 145* 139  K 4.5 4.3  CL 106 105  CO2 18* 25  GLUCOSE 86 132*  BUN 13 9  CALCIUM 9.9 9.9  CREATININE 1.06 1.02  GFRNONAA 84 >60  GFRAA 97 >60    LIVER FUNCTION TESTS: Recent Labs    11/03/17 0931  BILITOT 0.7  AST 64*  ALT 92*  ALKPHOS 79  PROT 7.4  ALBUMIN 4.9    Assessment and Plan:  For US guided random liver biopsy today for further workup of liver disease. Risks and benefits discussed with the patient including, but not limited to bleeding, infection, damage to adjacent structures or low yield requiring additional tests. All of the patient's questions were answered, patient is agreeable to proceed. Consent signed and in chart.  Thank you for this interesting consult.  I greatly enjoyed meeting Vidal R Leveque and look forward to participating in their care.  A copy of this report was sent to the requesting provider on this date.  Electronically Signed: Azzie Roup, MD 02/16/2018, 9:32 AM     I spent a total of 30 Minutes in face to face in clinical consultation, greater than 50% of which was counseling/coordinating care for liver biopsy.

## 2018-02-16 NOTE — Procedures (Signed)
Interventional Radiology Procedure Note  Procedure: US guided liver biopsy  Complications: None  Estimated Blood Loss: None  Findings: 18 G core biopsy x 3 via 17 G needle in right lobe of liver  Jarrad Mclees T. Kathlene Cote, M.D Pager:  [phone]

## 2018-02-18 ENCOUNTER — Other Ambulatory Visit: Payer: Self-pay

## 2018-02-18 ENCOUNTER — Inpatient Hospital Stay: Payer: BLUE CROSS/BLUE SHIELD | Attending: Hematology and Oncology | Admitting: Hematology and Oncology

## 2018-02-18 ENCOUNTER — Inpatient Hospital Stay: Payer: BLUE CROSS/BLUE SHIELD

## 2018-02-18 ENCOUNTER — Encounter: Payer: Self-pay | Admitting: Hematology and Oncology

## 2018-02-18 DIAGNOSIS — D751 Secondary polycythemia: Secondary | ICD-10-CM | POA: Diagnosis not present

## 2018-02-18 LAB — CBC WITH DIFFERENTIAL/PLATELET
ABS IMMATURE GRANULOCYTES: 0.01 10*3/uL (ref 0.00–0.07)
Basophils Absolute: 0 10*3/uL (ref 0.0–0.1)
Basophils Relative: 0 %
EOS PCT: 3 %
Eosinophils Absolute: 0.1 10*3/uL (ref 0.0–0.5)
HEMATOCRIT: 47.9 % (ref 39.0–52.0)
HEMOGLOBIN: 16.5 g/dL (ref 13.0–17.0)
Immature Granulocytes: 0 %
LYMPHS ABS: 1.2 10*3/uL (ref 0.7–4.0)
LYMPHS PCT: 25 %
MCH: 33 pg (ref 26.0–34.0)
MCHC: 34.4 g/dL (ref 30.0–36.0)
MCV: 95.8 fL (ref 80.0–100.0)
MONOS PCT: 13 %
Monocytes Absolute: 0.6 10*3/uL (ref 0.1–1.0)
NEUTROS ABS: 2.8 10*3/uL (ref 1.7–7.7)
Neutrophils Relative %: 59 %
Platelets: 198 10*3/uL (ref 150–400)
RBC: 5 MIL/uL (ref 4.22–5.81)
RDW: 12.8 % (ref 11.5–15.5)
WBC: 4.8 10*3/uL (ref 4.0–10.5)
nRBC: 0 % (ref 0.0–0.2)

## 2018-02-18 LAB — C-REACTIVE PROTEIN: CRP: 3.8 mg/dL — ABNORMAL HIGH (ref <1.0)

## 2018-02-18 LAB — SEDIMENTATION RATE: SED RATE: 16 mm/h — AB (ref 0–15)

## 2018-02-18 LAB — IRON AND TIBC
IRON: 74 ug/dL (ref 45–182)
SATURATION RATIOS: 29 % (ref 17.9–39.5)
TIBC: 258 ug/dL (ref 250–450)
UIBC: 184 ug/dL

## 2018-02-18 LAB — FERRITIN: Ferritin: 1170 ng/mL — ABNORMAL HIGH (ref 24–336)

## 2018-02-18 NOTE — Progress Notes (Signed)
Patient here today as new evaluation regarding hereditary hemochromatosis.  Referred by Dr. Marius Ditch.  Patient is accompanied by his mother today.

## 2018-02-18 NOTE — Progress Notes (Signed)
[facility] [facility] day:  02/18/2018  Chief Complaint: Ricky Mejia is a 47 y.o. male with hemochromatosis who is referred in consultation by Dr. Marius Ditch for assessment and management.  HPI: The patient has been followed by Dr. Marius Ditch for intermittent rectal bleeding and elevated transaminases.   Since 2017, LFTs have been twice upper normal limits.  Imaging revealed hepatic steatosis in 2017.  Hemoglobin has ranged between 16.2 - 18.1 from 10/01/2011 - 02/16/2018. WBC and platelet count have been normal.  He underwent a work-up for secondary liver disease.  Anti-smooth muscle antibody, mitochondrial antibodies, ceruloplasmin, and acute hepatitis panel were negative.  Ferritin was 480 and iron saturation 59% on 12/30/2017.  Hereditary hemochromatosis assay on 01/07/2018 revealed a single mutation, C282Y.  H63D and S65C were negative.  Liver biopsy was recommended.  Labs from 09/14/2014 revealed a ferritin of 171 and iron saturation 32%.  Abdomen and pelvic CT on 12/11/2015 revealed hepatomegaly with extensive hepatic steatosis.  He underwent ultrasound guided liver biopsy on 02/16/2018 by Dr. Kathlene Cote.  Pathology is pending.  Symptomatically, patient does not "feel too bad". Following his biopsy, patient was "sick". He had cough, nausea, and vomiting. Symptoms have resolved. Patient has had fatigue and generalized weakness since the beginning of this year (2019). Until recently, patient was working 2 full time jobs. He was working 80+ hours week. At that time, patient had "lots of energy".  In 06/2017, patient quit one of his jobs and has been working only 40 hours. Since the reduction in his hours, he has been increasingly fatigued.   Patient denies that he has experienced any B symptoms. He denies any interval infections. Patient with a 2 week history of worsening hyperopia (blurred). No abdominal or urinary symptoms. 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 Patient denies any new medications or herbal products. He does not have a known OSAH diagnosis. Patient denies exogenous testosterone use.   Father had a history of metastatic lung cancer secondary to [place]. Father also required routine therapeutic phlebotomies. Father's diagnosis is unknown. Patient's mother provides verbal consent for deceased patient record review for comprehensive care purposes.   Patient advises that he maintains an adequate appetite. He is eating well. Weight today is 269 lb 6 oz (122.2 kg).  Patient denies pain in the clinic today.   Past Medical History:  Diagnosis Date  . Abnormal LFTs   . Allergic rhinitis   . Diverticulitis   . 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 Hyperplastic colon polyp   . Obesity   . Serrated adenoma of colon   . 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 COLONOSCOPY    . COLONOSCOPY WITH PROPOFOL N/A 01/07/2018   Procedure: COLONOSCOPY WITH PROPOFOL;  Surgeon: Lin Landsman, MD;  Location: [facility] ENDOSCOPY;  Service: Gastroenterology;  Laterality: N/A;  . SIGMOIDOSCOPY    . WISDOM TOOTH EXTRACTION      Family History  Problem Relation Age of Onset  . Lupus Mother   . Lung cancer Father   . Diabetes Mellitus I Father   . Cancer Father   . Osteoarthritis Maternal Grandmother   . Cancer Maternal Grandmother   . Osteoarthritis Maternal Grandfather   . Colon cancer Maternal Grandfather   . Osteoarthritis Paternal Grandmother   . Osteoarthritis Paternal Grandfather     Social History:  reports that he has never smoked. His smokeless tobacco use includes chew. He reports that he does not drink alcohol or use drugs.  No smoking, ETOH, or illegal substance use. Patient dips tobacco. Patient denies known exposures to radiation on toxins.  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 The patient is accompanied by his  mother today.  Allergies:  Allergies  Allergen Reactions  . Flonase [Fluticasone Propionate] Other (See Comments)    nosebleeds  . Sulfa Antibiotics Rash    Current Medications: Current Outpatient Medications  Medication Sig Dispense Refill  . levocetirizine (XYZAL) 5 MG tablet TAKE 1 TABLET (5 MG TOTAL) BY MOUTH EVERY EVENING.    . Multiple Vitamin (MULTIVITAMIN) tablet Take 1 tablet by mouth daily.     No current facility-administered medications for this visit.     Review of Systems:  GENERAL:  Feels "not too bad".  Fatigue.  No fevers, sweats.  Weight gain. PERFORMANCE STATUS (ECOG):  1 HEENT:  Change in vision (plans to see ophthalmologist).  Runny nose.  No sore throat, mouth sores or tenderness. Lungs: No shortness of breath or cough.  No hemoptysis. Cardiac:  No chest pain, palpitations, orthopnea, or PND. GI:  No nausea, vomiting, diarrhea, constipation, melena or hematochezia. GU:  No urgency, frequency, dysuria, or hematuria. Musculoskeletal:  No back pain.  No joint pain.  No muscle tenderness. Extremities:  No pain or swelling. Skin:  No rashes or skin changes. Neuro:  No headache, numbness or weakness, balance or coordination issues. Endocrine:  No diabetes, thyroid issues, hot flashes or night sweats. Psych:  No mood changes, depression or anxiety. Pain:  No focal pain. Review of systems:  All other systems reviewed and found to be negative.  Physical Exam: Blood pressure 136/88, pulse 93, temperature (!) 97.5 F (36.4 C), temperature source Tympanic, resp. rate 18, height 6' 1"  (1.854 m), weight 269 lb 6 oz (122.2 kg). GENERAL:  Well developed, well nourished, gentleman sitting comfortably in the exam room in no acute distress. MENTAL STATUS:  Alert and oriented to person, place and time. HEAD:  Wearing a cap.  Brown hair.  Normocephalic, atraumatic, face symmetric, no Cushingoid features. EYES:  Glasses.  Pupils equal round and reactive to light and  accomodation.  No conjunctivitis or scleral icterus. ENT:  Oropharynx clear without lesion.  Tongue normal. Mucous membranes moist.  RESPIRATORY:  Clear to auscultation without rales, wheezes or rhonchi. CARDIOVASCULAR:  Regular rate and rhythm without murmur, rub or gallop. ABDOMEN:  Soft, non-tender, with active bowel sounds, and no hepatosplenomegaly.  No masses. SKIN:  Small bandage overlying liver s/p biopsy.  No rashes, ulcers or lesions. EXTREMITIES: No edema, no skin discoloration or tenderness.  No palpable cords. LYMPH NODES: No palpable cervical, supraclavicular, axillary or inguinal adenopathy  NEUROLOGICAL: Unremarkable. PSYCH:  Appropriate.   Hospital Outpatient Visit on 02/16/2018  Component Date Value Ref Range Status  . WBC 02/16/2018 6.5  4.0 - 10.5 K/uL Final  . RBC 02/16/2018 4.92  4.22 - 5.81 MIL/uL Final  . Hemoglobin 02/16/2018 16.8  13.0 - 17.0 g/dL Final  . HCT 02/16/2018 46.6  39.0 - 52.0 % Final  . MCV 02/16/2018 94.7  80.0 - 100.0 fL Final  . MCH 02/16/2018 34.1* 26.0 - 34.0 pg Final  . MCHC 02/16/2018 36.1* 30.0 - 36.0 g/dL Final  . RDW 02/16/2018 12.7  11.5 - 15.5 % Final  . Platelets 02/16/2018 200  150 - 400 K/uL Final  . nRBC 02/16/2018 0.0  0.0 - 0.2 % Final   Performed at [facility], [address] [place], [place] [address]  . Prothrombin Time 02/16/2018 11.6  11.4 - 15.2 seconds Final  . INR 02/16/2018 0.85   Final   Performed at [facility], [address] [place], [place] [address]    Assessment:  Ricky Mejia is a 47 y.o. 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  He is s/p ultrasound guided liver biopsy on 02/16/2018.  Pathology is pending.  Ferritin was 171 and iron saturation 32% on 09/14/2014.  Ferritin was 480 and iron saturation 59% on 12/30/2017.  Abdomen and pelvic CT on 12/11/2015 revealed hepatomegaly with extensive hepatic steatosis.  He has polycythemia.  Hemoglobin has ranged between 16.2 -  18.1 from 10/01/2011 - 02/16/2018.  Patient has a history of TTP/HUS approximately 5-6 years ago.  His father required regular phlebotomies.  Symptomatically, he is fatigued.  Exam is unremarkable.  Plan: 1.  Labs today:  CBC with diff, ferritin, iron studies, sed rate, CRP,  epo level, carbon monoxide level, testosterone, JAK2 with reflex, HBV core Ab. 2.  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  Recent iron saturation c/w hereditary hemochromatosis (HH).  Ferritin can be an acute phase reactant (transiently elevated).  Await follow-up liver biopsy to assess iron stores.  Discuss signs and symptoms of HH.  Discuss management of HH with phlebotomy.  Goal ferritin 50-100.  Discuss inheritance of HH (recessive).  3.  Erythrocytosis:  Patient has had a hemoglobin > 16.5.  Discuss primary versus secondary polycythemia.  No cardiopulmonary history.  No uses of exogenous testosterone or sleep apnea.  Discuss further workup.    Discuss obtaining patient's father's medical history (patient's mother consented). 4.  RTC in 2 weeks for MD assessment, review of workup, and discussion regarding direction of therapy.   Honor Loh, NP 02/18/2018, 9:03 AM   I saw and evaluated the patient, participating in the key portions of the service and reviewing pertinent diagnostic studies and records.  I reviewed the nurse practitioner's note and agree with the findings and the plan.  The assessment and plan were discussed with the patient.  Multiple questions were asked by the patient and answered.   Nolon Stalls, MD 02/18/2018,9:03 AM

## 2018-02-19 LAB — ERYTHROPOIETIN: Erythropoietin: 9.9 m[IU]/mL (ref 2.6–18.5)

## 2018-02-19 LAB — TESTOSTERONE: TESTOSTERONE: 175 ng/dL — AB (ref 264–916)

## 2018-02-19 LAB — HEPATITIS B CORE ANTIBODY, TOTAL: HEP B C TOTAL AB: NEGATIVE

## 2018-02-19 LAB — SURGICAL PATHOLOGY

## 2018-02-19 LAB — CARBON MONOXIDE, BLOOD (PERFORMED AT REF LAB): Carbon Monoxide, Blood: 2.9 % (ref 0.0–3.6)

## 2018-03-03 LAB — CALR + JAK2 E12-15 + MPL (REFLEXED)

## 2018-03-03 LAB — JAK2 V617F, W REFLEX TO CALR/E12/MPL

## 2018-03-05 ENCOUNTER — Encounter: Payer: Self-pay | Admitting: Hematology and Oncology

## 2018-03-05 ENCOUNTER — Inpatient Hospital Stay (HOSPITAL_BASED_OUTPATIENT_CLINIC_OR_DEPARTMENT_OTHER): Payer: BLUE CROSS/BLUE SHIELD | Admitting: Hematology and Oncology

## 2018-03-05 DIAGNOSIS — D751 Secondary polycythemia: Secondary | ICD-10-CM

## 2018-03-05 NOTE — Progress Notes (Signed)
Patient here today for review of workup.  Offers no complaints today.

## 2018-03-05 NOTE — Progress Notes (Signed)
[facility] day:  03/05/2018  Chief Complaint: Ricky Mejia is a 47 y.o. male with hemochromatosis who is seen for review of work-up and discussion regarding direction of therapy.  HPI: The patient was last seen in the hematology clinic for initial consultation on 02/18/2018.  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   Ferritin was 480 and iron saturation 59% on 12/30/2017.  He was s/p ultrasound guided liver biopsy on 02/16/2018.  Pathology was pending.    He was noted to have polycythemia.  Hemoglobin has ranged between 16.2 - 18.1 from 10/01/2011 - 02/16/2018.    He underwent a work-up.  CBC revealed a hematocrit of 47.9, hemoglobin 16.5, MCV 95.8, platelets 198,000, WBC 4800 with an ANC of 2800.  Ferritin was 1170.  Iron saturation was 29% with a TIBC of 258.  Sed rate was 16 (0-15) and CRP 3.8 (< 1.0).  Hepatitis B core antibody was negative.  Epo level was 9.9 (2.6-18.5).  JAK2 V617F, exon 12-15, and MPL were negative.  Carbon monoxide was 2.9% (normal).  Testosterone was 175.  Ultrasound guided liver biopsy on 02/16/2018 revealed steatohepatitis, stage 0 (no fibrosis) with 90% steatosis (Brunt methodology).  There was minimal hepatocyte iron deposition (< 5%).  Features were consistent with fatty liver disease (NASH or alcohol  related). Wilson disease may be a consideration, with ceruloplasmin in the borderline range due to it being acute phase reactant (as may be the case with ferritin levels). Eye examination for Kayser-Fleischer rings and 24 hour urine copper test should be considered. If necessary, the liver biopsy tissue can be referred for quantitative copper testing.   Symptomatically, he denies any complaint.  He has a follow-up appointment with Dr. Marius Ditch on 05/11/2018.   Past Medical History:  Diagnosis Date  . Abnormal LFTs   . Allergic rhinitis   . Diverticulitis   . 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 Hyperplastic colon polyp   . Obesity   . Serrated adenoma of colon   . 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 COLONOSCOPY    . COLONOSCOPY WITH PROPOFOL N/A 01/07/2018   Procedure: COLONOSCOPY WITH PROPOFOL;  Surgeon: Lin Landsman, MD;  Location: [facility] ENDOSCOPY;  Service: Gastroenterology;  Laterality: N/A;  . SIGMOIDOSCOPY    . WISDOM TOOTH EXTRACTION      Family History  Problem Relation Age of Onset  . Lupus Mother   . Lung cancer Father   . Diabetes Mellitus I Father   . Cancer Father   . Osteoarthritis Maternal Grandmother   . Cancer Maternal Grandmother   . Osteoarthritis Maternal Grandfather   . Colon cancer Maternal Grandfather   . Osteoarthritis Paternal Grandmother   . Osteoarthritis Paternal Grandfather     Social History:  reports that he has never smoked. His smokeless tobacco use includes chew. He reports that he does not drink alcohol or use drugs.  No smoking, ETOH, or illegal substance use. Patient dips tobacco. Patient denies known exposures to radiation on toxins.  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 The patient is accompanied by his mother today.  Allergies:  Allergies  Allergen Reactions  . Flonase [Fluticasone Propionate] Other (See Comments)    nosebleeds  . Sulfa Antibiotics Rash    Current Medications: Current Outpatient Medications  Medication Sig Dispense Refill  . levocetirizine (XYZAL) 5 MG tablet TAKE 1 TABLET (5  MG TOTAL) BY MOUTH EVERY EVENING.    . Multiple Vitamin (MULTIVITAMIN) tablet Take 1 tablet by mouth daily.     No current facility-administered medications for this visit.     Review of Systems:  GENERAL:  Feels "ok".  No fevers, sweats or weight loss.  Weight up 3 pounds. PERFORMANCE STATUS (ECOG):  1 HEENT:  Change in vision (plans to see ophthalmologist).  No runny nose, sore throat, mouth sores or tenderness. Lungs: No shortness of breath or cough.  No  hemoptysis. Cardiac:  No chest pain, palpitations, orthopnea, or PND. GI:  No nausea, vomiting, diarrhea, constipation, melena or hematochezia. GU:  No urgency, frequency, dysuria, or hematuria. Musculoskeletal:  No back pain.  No joint pain.  No muscle tenderness. Extremities:  No pain or swelling. Skin:  No rashes or skin changes. Neuro:  No headache, numbness or weakness, balance or coordination issues. Endocrine:  No diabetes, thyroid issues, hot flashes or night sweats. Psych:  No mood changes, depression or anxiety. Pain:  No focal pain. Review of systems:  All other systems reviewed and found to be negative.   Physical Exam: Blood pressure 137/88, pulse 80, temperature 97.8 F (36.6 C), temperature source Tympanic, resp. rate 18, weight 272 lb 3 oz (123.5 kg). GENERAL:  Well developed, well nourished, gentleman sitting comfortably in the exam room in no acute distress. MENTAL STATUS:  Alert and oriented to person, place and time. HEAD:  Wearing a black cap.  Brown hair.  Normocephalic, atraumatic, face symmetric, no Cushingoid features. EYES:  Glasses.  No conjunctivitis or scleral icterus. NEUROLOGICAL: Unremarkable. PSYCH:  Appropriate.    No visits with results within 3 Day(s) from this visit.  Latest known visit with results is:  Orders Only on 02/18/2018  Component Date Value Ref Range Status  . Hep B Core Total Ab 02/18/2018 Negative  Negative Final   Comment: (NOTE) Performed At: [facility] [place], [place] [id number] Rush Farmer MD [id number]:[id number]   . JAK2 GenotypR 02/18/2018 Comment   Final   Comment: (NOTE) Result: NEGATIVE for the JAK2 V617F mutation. Interpretation:  The G to T nucleotide change encoding the V617F mutation was not detected.  This result does not rule out the presence of the JAK2 mutation at a level below the sensitivity of detection of this assay, or the presence of other mutations within JAK2 not detected by this  assay.  This result does not rule out a diagnosis of polycythemia vera, essential thrombocythemia or idiopathic myelofibrosis as the V617F mutation is not detected in all patients with these disorders.   Marland Kitchen BACKGROUND: 02/18/2018 Comment   Final   Comment: (NOTE) JAK2 is a cytoplasmic tyrosine kinase with a key role in signal transduction from multiple hematopoietic growth factor receptors. A point mutation within exon 14 of the JAK2 gene ([id number]) encoding a valine to phenylalanine substitution at position 617 of the JAK2 protein (V617F) has been identified in most patients with polycythemia vera, and in about half of those with either essential thrombocythemia or idiopathic myelofibrosis. The V617F has also been detected, although infrequently, in other myeloid disorders such as chronic myelomonocytic leukemia and chronic neutrophilic luekemia. V617F is an acquired mutation that alters a highly conserved valine present in the negative regulatory JH2 domain of the JAK2 protein and is predicted to dysregulate kinase activity. Methodology: Total genomic DNA was extracted and subjected to TaqMan real-time PCR amplification/detection. Two amplification products per sample were monitored by real-time PCR using primers/probes s  pecific to JAK2 wild type (WT) and JAK2 mutant V617F. The ABI7900 Absolute Quantitation software will compare the patient specimen valuse to the standard curves and generate percent values for wild type and mutant type. In vitro studies have indicated that this assay has an analytical sensitivity of 1%. References: Baxter EJ, Scott Phineas Real, et al. Acquired mutation of the tyrosine kinase JAK2 in human myeloproliferative disorders. Lancet. 2005 Mar 19-25; 365(9464):1054-1061. Alfonso Ramus Couedic JP. A unique clonal JAK2 mutation leading to constitutive signaling causes polycythaemia vera. Nature. 2005 Apr 28;  434(7037):1144-1148. Kralovics R, Passamonti F, Buser AS, et al. A gain-of-function mutation of JAK2 in myeloproliferative disorders. N Engl J Med. 2005 Apr 28; 352(17):1779-1790.   . Director Review, JAK2 02/18/2018 Comment   Final   Comment: (NOTE) Katina Degree, MD, PhD Director, [place] [facility] and [place], [place] [address] [phone] This test was developed and its performance characteristics determined by [facility]. It has not been cleared or approved by the Food and Drug Administration.   Marland Kitchen REFLEX: 02/18/2018 Comment   Final   Comment: (NOTE) Reflex to CALR Mutation Analysis, JAK2 Exon 12-15 Mutation Analysis, and MPL Mutation Analysis is indicated.   Marland Kitchen Extraction 02/18/2018 Completed   Corrected   Comment: (NOTE) Performed At: [facility] [address] [place], [place] [id number] Nechama Guard MD HE:[id number] Performed At: [facility] [place], [place] [id number] Nechama Guard MD [id number]   . Testosterone 02/18/2018 175* 264 - 916 ng/dL Final   Comment: (NOTE) Adult male reference interval is based on a population of healthy nonobese males (BMI <30) between 58 and 67 years old. [place], [place] [id number]. PMID: [id number]. Performed At: [facility] [place], [place] [id number] Rush Farmer MD [id number]   . Carbon Monoxide, Blood 02/18/2018 2.9  0.0 - 3.6 % Final   Comment: (NOTE)                            Environmental Exposure:                             Nonsmokers           <3.7                             Smokers              <9.9                            Occupational Exposure:                             BEI                   3.5                                Detection Limit =  0.2 Performed At: [facility] [place], [place] [id number] Rush Farmer MD QI:[id number]   . Erythropoietin  02/18/2018 9.9  2.6 - 18.5 mIU/mL Final   Comment: (NOTE) Beckman Coulter UniCel DxI [place] obtained with  different assay methods or kits cannot be used interchangeably. Results cannot be interpreted as absolute evidence of the presence or absence of malignant disease. Performed At: [facility] [place], [place] [id number] Rush Farmer MD [id number]   . CRP 02/18/2018 3.8* <1.0 mg/dL Final   Performed at [facility] [address] [place], [place] [address]  . Sed Rate 02/18/2018 16* 0 - 15 mm/hr Final   Performed at [facility], [address] [place], [place] [address]  . Iron 02/18/2018 74  45 - 182 ug/dL Final  . TIBC 02/18/2018 258  250 - 450 ug/dL Final  . Saturation Ratios 02/18/2018 29  17.9 - 39.5 % Final  . UIBC 02/18/2018 184  ug/dL Final   Performed at [facility], [address] [place], [place] [address]  . Ferritin 02/18/2018 1,170* 24 - 336 ng/mL Final   Performed at [facility], [address] [place], [place] [address]  . WBC 02/18/2018 4.8  4.0 - 10.5 K/uL Final  . RBC 02/18/2018 5.00  4.22 - 5.81 MIL/uL Final  . Hemoglobin 02/18/2018 16.5  13.0 - 17.0 g/dL Final  . HCT 02/18/2018 47.9  39.0 - 52.0 % Final  . MCV 02/18/2018 95.8  80.0 - 100.0 fL Final  . MCH 02/18/2018 33.0  26.0 - 34.0 pg Final  . MCHC 02/18/2018 34.4  30.0 - 36.0 g/dL Final  . RDW 02/18/2018 12.8  11.5 - 15.5 % Final  . Platelets 02/18/2018 198  150 - 400 K/uL Final  . nRBC 02/18/2018 0.0  0.0 - 0.2 % Final  . Neutrophils Relative % 02/18/2018 59  % Final  . Neutro Abs 02/18/2018 2.8  1.7 - 7.7 K/uL Final  . Lymphocytes Relative 02/18/2018 25  % Final  . Lymphs Abs 02/18/2018 1.2  0.7 - 4.0 K/uL Final  . Monocytes Relative 02/18/2018 13  % Final  . Monocytes Absolute 02/18/2018 0.6  0.1 - 1.0 K/uL Final  . Eosinophils Relative 02/18/2018 3  % Final  . Eosinophils Absolute 02/18/2018 0.1  0.0 - 0.5  K/uL Final  . Basophils Relative 02/18/2018 0  % Final  . Basophils Absolute 02/18/2018 0.0  0.0 - 0.1 K/uL Final  . Immature Granulocytes 02/18/2018 0  % Final  . Abs Immature Granulocytes 02/18/2018 0.01  0.00 - 0.07 K/uL Final   Performed at [facility], [address], [place], [place] [address]  . CALR Mutation Detection Result 02/18/2018 Comment   Final   Comment: (NOTE) NEGATIVE No insertions or deletions were detected within the analyzed region of the calreticulin (CALR) gene. A negative result does not entirely exclude the possibility of a clonal population carrying CALR gene mutations that are not covered by this assay. Results should be interpreted in conjunction with clinical and laboratory findings for the most accurate interpretation.   . Background: 02/18/2018 Comment   Final   Comment: (NOTE) The calcium-binding endoplasmic reticulin chaperone protein, calreticulin (CALR), is somatically mutated in approximately 70% of patients with JAK2-negative essential thrombocythemia (ET) and 60- 88% of patients with JAK2-negative primary myelofibrosis(PMF). Only a minority of patients (approximately 8%) with myelodysplasia have mutations in  CALR gene. CALR mutations are rarely detected in patients with de novo acute myeloid leukemia, chronic myelogenous leukemia, lymphoid leukemia, or solid tumors. CALR mutations are not detected in polycythemia and generally appear to be mutually exclusive with JAK2 mutations and MPL mutations. The majority of mutational changes involve a variety of  insertion or deletion mutations in exon 9 of the calreticulin gene: approximately 53% of all CALR mutations are a 52 bp deletion (type-1) while the second most prevalent mutation (approximately 32%) contains a 5 bp insertion (type-2). Other mutations (non-type 1 or type 2) are seen                           in a small minority of cases. CALR mutations in PMF tend to be associated with  a favorable prognosis compared to JAK2 V617F mutations, whereas primary myelofibrosis negative for CALR, JAK2 V617F and MPL mutations (so-called triple negative) is associated with a poor prognosis and shorter survival. The detection of a CALR gene mutation aids in the specific diagnosis of a myeloproliferative neoplasm, and help distinguish this clonal disease from a benign reactive process.   . Methodology: 02/18/2018 Comment   Final   Comment: (NOTE) Genomic DNA was isolated from the provided specimen. Polymerase chain reaction (PCR) of exon 9 of the CALR gene was performed with specific fluorescent-labeled primers, and the PCR product was analyzed by capillary gel electrophoresis to determine the size of the PCR products. This PCR assay is capable of detecting a mutant cell population with a sensitivity of 5 mutant cells per 100 normal cells. A negative result does not exclude the presence of a myeloproliferative disorder or other neoplastic process. This test was developed and its performance characteristics determined by [facility]. It has not been cleared or approved by the Food and Drug Administration. The FDA has determined that such clearance or approval is not necessary.   . References: 02/18/2018 Comment   Final   Comment: (NOTE) 1. Klampfel, T. et al. (2013) Somatic mutations of calreticulin in   myeloproliferative neoplasms. New Engl. J. Med. 035:5974-1638. 2. Haynes Kerns et al. (2013) Somatic CALR mutations in   myeloproliferative neoplasms with nonmutated JAK2. New Engl. J.   Med. [id number].   Marland Kitchen Director Review 02/18/2018 Comment   Final   Comment: (NOTE) Constance Goltz, PhD, [facility]               Director, [place] for [place] and [place], [place]   . JAK2 Exons 12-15 Mut Det PCR: 02/18/2018 Comment   Final   Comment: (NOTE) NEGATIVE JAK2  mutations were not detected in exons 12, 13, 14 and 15. This result does not rule out the presence of JAK2 mutation at a level below the detection sensitivity of this assay, the presence of other mutations outside the analyzed region of the JAK2 gene, or the presence of a myeloproliferative or other neoplasm. Result must be correlated with other clinical data for the most accurate diagnosis.   Marland Kitchen BACKGROUND: 02/18/2018 Comment   Final   Comment: (NOTE) JAK2 V617F mutation is detected in patients with polycythemia vera (95%), essential thrombocythemia (50%) and primary myelofibrosis (50%). A small percentage of JAK2 mutation positive patients (3.3%) contain other non-V617F mutations within exons 12 to 15. The detection of a JAK2 gene mutation aids in the specific diagnosis of a myeloproliferative neoplasm, and help distinguish this clonal disease from a benign reactive process.   Marland Kitchen  Method 02/18/2018 Comment   Final   Comment: (NOTE) Total RNA was purified from the provided specimen. The JAK2 gene region covering exons 12 to 15 was subjected to reverse- transcription coupled PCR amplification, and bi-directional sequencing to identify sequence variations. This assay has a sensitivity to detect approximately 15% population of cells containing the JAK2 mutations in a background of non-mutant cells. This test was developed and its performance characteristics determined by [facility]. It has not been cleared or approved by the Food and Drug Administration.   . References 02/18/2018 Comment   Final   Comment: (NOTE) Algasham, N. et al. Detection of mutations in JAK2 exons 12-15 by Sanger sequencing. Int J Lab Hemato. 2015, 38:34-41. Joelene Millin al. Mutation profile of JAK2 transcripts in patients with chronic myeloproliferative neoplasias. J Mol Diagn. 2009, 11:49-53.   Marland Kitchen DIRECTOR REVIEW: 02/18/2018 Comment   Final   Comment: (NOTE) Loni Muse, PhD, [facility]    Director, [place] [facility] and [facility]ology    [address] [place], [place] [address]    [phone]   . MPL MUTATION ANALYSIS RESULT: 02/18/2018 Comment   Final   Comment: (NOTE) No MPL mutation was identified in the provided specimen of this individual. Results should be interpreted in conjunction with clinical and other laboratory findings for the most accurate interpretation.   Marland Kitchen BACKGROUND: 02/18/2018 Comment   Final   Comment: (NOTE) MPL (myeloproliferative leukemia virus oncogene homology) belongs to the hematopoietin superfamily and enables its ligand thrombopoietin to facilitate both global hematopoiesis and megakaryocyte growth and differentiation. MPL W515 mutations are present in patients with primary myelofibrosis (PMF) and essential thrombocythemia (ET) at a frequency of approximately 5% and 1% respectively. The S505 mutation is detected in patients with hereditary thrombocythemia.   Marland Kitchen METHODOLOGY: 02/18/2018 Comment   Final   Comment: (NOTE) Genomic DNA was purified from the provided specimen. MPL gene region covering the S505N and W515L/K mutations were subjected to PCR amplification and bi-directional sequencing in duplicate to identify sequence variations. This assay has a sensitivity to detect approximately 20-25% population of cells containing the MPL mutations in a background of non-mutant cells. This assay will not detect the mutation below the sensitivity of this assay. Molecular- based testing is highly accurate, but as in any laboratory test, rare diagnostic errors may occur.   Marland Kitchen REFERENCES: 02/18/2018 Comment   Final   Comment: (NOTE) 1. Pardanani AD, et al. (2006). MPL515 mutations in   myeloproliferative and other myeloid disorders: a study   of 1182 patients. Blood 646:8032-1224. 2. Andre Lefort and Levine RL. (2008). JAK2 and MPL   mutations in myeloproliferative neoplasms: discovery and   science. Leukemia 22:1813-1817. 3. Juline Patch, et  al. (2009). Evidence for a founder effect   of the MPL-S505N mutation in eight [place] pedigrees with   hereditary thrombocythemia. Haematologica 94(10):1368-   8250.   Marland Kitchen DIRECTOR REVIEW: 02/18/2018 Comment   Final   Comment: (NOTE) Loni Muse, PhD, [facility]    Director, [place] for [place] and [place], [place] [address]    [phone] This test was developed and its performance characteristics determined by [facility]. It has not been cleared or approved by the Food and Drug Administration.   . Extraction 02/18/2018 Comment   Final   Comment: (NOTE) This sample has been received and DNA extraction has been performed. Performed At: [facility] [address] [place], [place]  [id number] Nechama Guard MD [id number] Performed At: [facility] [place], [place] [id number] Nechama Guard MD [id number]     Assessment:  Ricky Mejia is a 47 y.o. 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  Ultrasound guided liver biopsy on 02/16/2018 revealed  steatohepatitis, stage 0 (no fibrosis) with 90% steatosis (Brunt methodology).  There was minimal hepatocyte iron deposition (< 5%).  Features were consistent with fatty liver disease (NASH or alcohol  related).  Ferritin has been followed: 171 on 09/14/2014, 480 on 12/30/2017, and 1170 on 02/18/2018.  Iron saturation was 32% on 09/14/2014, 59% on 12/30/2017, and 29% on 02/18/2018.  Sed rate was 16 (0-15) and CRP 3.8 (< 1.0) on 02/18/2018.  Work-up on 02/18/2018 revealed a hematocrit of 47.9, hemoglobin 16.5, MCV 95.8, platelets 198,000, WBC 4800 with an ANC of 2800.  Ferritin was 1170.  Iron saturation was 29% with a TIBC of 258.  Sed rate was 16 (0-15) and CRP 3.8 (< 1.0).  Hepatitis B core antibody was negative.    Abdomen and pelvic CT on 12/11/2015 revealed hepatomegaly with extensive hepatic steatosis.  Ultrasound guided liver biopsy on  02/16/2018 revealed steatohepatitis, stage 0 (no fibrosis) with 90% steatosis (Brunt methodology).  There was minimal hepatocyte iron deposition (< 5%).  Features were consistent with fatty liver disease (NASH or alcohol  related). Wilson disease may be a consideration, with ceruloplasmin in the borderline range due to it being acute phase reactant (as may be the case with ferritin levels). Eye examination for Kayser-Fleischer rings and 24 hour urine copper test should be considered. If necessary, the liver biopsy tissue can be referred for quantitative copper testing.   He has polycythemia.  Hemoglobin has ranged between 16.2 - 18.1 from 10/01/2011 - 02/16/2018.  Negative studies on 02/18/2018 revealed epo level (9.9), carbon monoxide leve (2.9%), testosterone (175), O2 sats (96%), JAK2 V617F, exon 12-15, and MPL.  Patient has a history of TTP/HUS approximately 5-6 years ago.  His father required regular phlebotomies.  Symptomatically, he denies any complaint.  Exam is stable.  Plan: 1.  Review work-up. 2.  Review interim liver biopsy.  Patient has follow-up appointment with Dr. Marius Ditch. 3.  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  Patient has discordant iron saturation (recently normal) and ferritin (high).  Patient has had variable iron saturation.  Patients with HH typically have an iron saturation of >45%.  Patient has an elevated ferritin.  Etiology may be due to liver disease or inflammation (CRP high).  Liver biopsy revealed hepatic steatosis.  No current evidence of increased iron stores.  Concern raised by pathology re: consideration of Wilson's disease. 4.  Erythrocytosis:  Patient has had a hemoglobin > 16.5.  Work-up is negative.  No evidence of primary polycythemia.  Discuss consideration of phlebotomy to maintain hemoglobin in normal range. 5.  RTC in 1 month for labs (CBC  with diff, CRP, sed rate), and +/- phlebotomy.   Honor Loh, NP 03/05/2018, 10:47 AM   I saw and evaluated the patient, participating in the key portions of the service and reviewing pertinent diagnostic studies and records.  I reviewed the nurse practitioner's note and agree with the findings and the plan.  The assessment and plan were discussed with the patient.  Several questions were asked by the patient and answered.   Nolon Stalls, MD 03/05/2018,10:47 AM

## 2018-03-16 ENCOUNTER — Encounter: Payer: Self-pay | Admitting: Gastroenterology

## 2018-03-19 ENCOUNTER — Other Ambulatory Visit: Payer: Self-pay

## 2018-04-05 ENCOUNTER — Inpatient Hospital Stay: Payer: BLUE CROSS/BLUE SHIELD | Attending: Hematology and Oncology

## 2018-04-05 ENCOUNTER — Inpatient Hospital Stay: Payer: BLUE CROSS/BLUE SHIELD

## 2018-04-05 ENCOUNTER — Other Ambulatory Visit: Payer: Self-pay

## 2018-04-05 ENCOUNTER — Other Ambulatory Visit: Payer: Self-pay | Admitting: *Deleted

## 2018-04-05 ENCOUNTER — Encounter: Payer: Self-pay | Admitting: Hematology and Oncology

## 2018-04-05 ENCOUNTER — Inpatient Hospital Stay (HOSPITAL_BASED_OUTPATIENT_CLINIC_OR_DEPARTMENT_OTHER): Payer: BLUE CROSS/BLUE SHIELD | Admitting: Hematology and Oncology

## 2018-04-05 DIAGNOSIS — Z862 Personal history of diseases of the blood and blood-forming organs and certain disorders involving the immune mechanism: Secondary | ICD-10-CM

## 2018-04-05 DIAGNOSIS — D751 Secondary polycythemia: Secondary | ICD-10-CM

## 2018-04-05 DIAGNOSIS — K7581 Nonalcoholic steatohepatitis (NASH): Secondary | ICD-10-CM

## 2018-04-05 DIAGNOSIS — R7989 Other specified abnormal findings of blood chemistry: Secondary | ICD-10-CM | POA: Diagnosis not present

## 2018-04-05 LAB — CBC WITH DIFFERENTIAL/PLATELET
Abs Immature Granulocytes: 0.02 10*3/uL (ref 0.00–0.07)
Basophils Absolute: 0 10*3/uL (ref 0.0–0.1)
Basophils Relative: 1 %
Eosinophils Absolute: 0.1 10*3/uL (ref 0.0–0.5)
Eosinophils Relative: 2 %
HCT: 44.7 % (ref 39.0–52.0)
Hemoglobin: 15.9 g/dL (ref 13.0–17.0)
Immature Granulocytes: 0 %
Lymphocytes Relative: 25 %
Lymphs Abs: 1.7 10*3/uL (ref 0.7–4.0)
MCH: 33.1 pg (ref 26.0–34.0)
MCHC: 35.6 g/dL (ref 30.0–36.0)
MCV: 92.9 fL (ref 80.0–100.0)
Monocytes Absolute: 0.7 10*3/uL (ref 0.1–1.0)
Monocytes Relative: 10 %
Neutro Abs: 4.3 10*3/uL (ref 1.7–7.7)
Neutrophils Relative %: 62 %
Platelets: 219 10*3/uL (ref 150–400)
RBC: 4.81 MIL/uL (ref 4.22–5.81)
RDW: 12.2 % (ref 11.5–15.5)
WBC: 6.8 10*3/uL (ref 4.0–10.5)
nRBC: 0 % (ref 0.0–0.2)

## 2018-04-05 LAB — FERRITIN: Ferritin: 314 ng/mL (ref 24–336)

## 2018-04-05 LAB — C-REACTIVE PROTEIN: CRP: 1.3 mg/dL — ABNORMAL HIGH (ref <1.0)

## 2018-04-05 LAB — SEDIMENTATION RATE: Sed Rate: 11 mm/hr (ref 0–15)

## 2018-04-05 NOTE — Progress Notes (Signed)
[facility] day:  04/05/2018  Chief Complaint: Ricky Mejia is a 47 y.o. male with hemochromatosis and secondary polycythemia who is seen for 1 month assessment.  HPI: The patient was last seen in the hematology clinic on 03/05/2018.  At that time, he denied any complaint.  Work-up was reviewed.  Liver biopsy results were discussed.  There was minimal hepatocyte iron deposition.  We discussed consideration of phlebotomy to maintain a normal hematocrit.  During the interim, he has felt fine.  He notes some problem with his eyes.  He states that Dr Marius Ditch is working him up for Wilson's disease.  He denies any family history of any blood disorders.   Past Medical History:  Diagnosis Date  . Abnormal LFTs   . Allergic rhinitis   . Diverticulitis   . 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 Hyperplastic colon polyp   . Obesity   . Serrated adenoma of colon   . 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 COLONOSCOPY    . COLONOSCOPY WITH PROPOFOL N/A 01/07/2018   Procedure: COLONOSCOPY WITH PROPOFOL;  Surgeon: Lin Landsman, MD;  [facility] END[facility]COPY;  Service: Gastroenterology;  Laterality: N/A;  . SIGMOIDOSCOPY    . WISDOM TOOTH EXTRACTION      Family History  Problem Relation Age of Onset  . Lupus Mother   . Lung cancer Father   . Diabetes Mellitus I Father   . Cancer Father   . Osteoarthritis Maternal Grandmother   . Cancer Maternal Grandmother   . Osteoarthritis Maternal Grandfather   . Colon cancer Maternal Grandfather   . Osteoarthritis Paternal Grandmother   . Osteoarthritis Paternal Grandfather     Social History:  reports that he has never smoked. His smokeless tobacco use includes chew. He reports that he does not drink alcohol or use drugs.  No smoking, ETOH, or illegal substance use. Patient dips tobacco. Patient denies known exposures to radiation on toxins.  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 The patient is accompanied by his mother, Lelan Pons, today.  Allergies:  Allergies  Allergen Reactions  . Flonase [Fluticasone Propionate] Other (See Comments)    nosebleeds  . Sulfa Antibiotics Rash    Current Medications: Current Outpatient Medications  Medication Sig Dispense Refill  . Multiple Vitamin (MULTIVITAMIN) tablet Take 1 tablet by mouth daily.    Marland Kitchen levocetirizine (XYZAL) 5 MG tablet TAKE 1 TABLET (5 MG TOTAL) BY MOUTH EVERY EVENING.     No current facility-administered medications for this visit.     Review of Systems:  GENERAL:  Feels "fine".  No fevers, sweats.  Weight stable. PERFORMANCE STATUS (ECOG):  1 HEENT:  Visual changes, seeing ophthalmology.  No runny nose, sore throat, mouth sores or tenderness. Lungs: No shortness of breath or cough.  No hemoptysis. Cardiac:  No chest pain, palpitations, orthopnea, or PND. GI:  No nausea, vomiting, diarrhea, constipation, melena or hematochezia. GU:  No urgency, frequency, dysuria, or hematuria. Musculoskeletal:  No back pain.  No joint pain.  No muscle tenderness. Extremities:  No pain or swelling. Skin:  No rashes or skin changes. Neuro:  No headache, numbness or weakness, balance or coordination issues. Endocrine:  No diabetes, thyroid issues, hot flashes or night sweats. Psych:  No mood changes, depression or anxiety. Pain:  No focal pain. Review of systems:  All other systems reviewed and found to be negative.  Physical Exam: Blood pressure 126/83, pulse 89, temperature 97.6 F (36.4 C), temperature source Tympanic, resp. rate 18, weight 272 lb 4.8 oz (123.5 kg). GENERAL:  Well developed, well nourished, gentleman sitting comfortably in the exam room in no acute distress. MENTAL STATUS:  Alert and oriented to person, place and time. HEAD:  Brown hair.  Normocephalic, atraumatic, face symmetric, no Cushingoid features. EYES:  Glasses.  Pupils equal round and reactive to light and  accomodation.  No conjunctivitis or scleral icterus. ENT:  Oropharynx clear without lesion.  Tongue normal. Mucous membranes moist.  RESPIRATORY:  Clear to auscultation without rales, wheezes or rhonchi. CARDIOVASCULAR:  Regular rate and rhythm without murmur, rub or gallop. ABDOMEN:  Soft, non-tender, with active bowel sounds, and no hepatosplenomegaly.  No masses. SKIN:  No rashes, ulcers or lesions. EXTREMITIES: No edema, no skin discoloration or tenderness.  No palpable cords. LYMPH NODES: No palpable cervical, supraclavicular, axillary or inguinal adenopathy  NEUROLOGICAL: Unremarkable. PSYCH:  Appropriate.    Orders Only on 04/05/2018  Component Date Value Ref Range Status  . WBC 04/05/2018 6.8  4.0 - 10.5 K/uL Final  . RBC 04/05/2018 4.81  4.22 - 5.81 MIL/uL Final  . Hemoglobin 04/05/2018 15.9  13.0 - 17.0 g/dL Final  . HCT 04/05/2018 44.7  39.0 - 52.0 % Final  . MCV 04/05/2018 92.9  80.0 - 100.0 fL Final  . MCH 04/05/2018 33.1  26.0 - 34.0 pg Final  . MCHC 04/05/2018 35.6  30.0 - 36.0 g/dL Final  . RDW 04/05/2018 12.2  11.5 - 15.5 % Final  . Platelets 04/05/2018 219  150 - 400 K/uL Final  . nRBC 04/05/2018 0.0  0.0 - 0.2 % Final  . Neutrophils Relative % 04/05/2018 62  % Final  . Neutro Abs 04/05/2018 4.3  1.7 - 7.7 K/uL Final  . Lymphocytes Relative 04/05/2018 25  % Final  . Lymphs Abs 04/05/2018 1.7  0.7 - 4.0 K/uL Final  . Monocytes Relative 04/05/2018 10  % Final  . Monocytes Absolute 04/05/2018 0.7  0.1 - 1.0 K/uL Final  . Eosinophils Relative 04/05/2018 2  % Final  . Eosinophils Absolute 04/05/2018 0.1  0.0 - 0.5 K/uL Final  . Basophils Relative 04/05/2018 1  % Final  . Basophils Absolute 04/05/2018 0.0  0.0 - 0.1 K/uL Final  . Immature Granulocytes 04/05/2018 0  % Final  . Abs Immature Granulocytes 04/05/2018 0.02  0.00 - 0.07 K/uL Final   Performed at [facility], [address] [place], [place] [address]    Assessment:  BATES COLLINGTON is a 47 y.o.  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  Ultrasound guided liver biopsy on 02/16/2018 revealed  steatohepatitis, stage 0 (no fibrosis) with 90% steatosis (Brunt methodology).  There was minimal hepatocyte iron deposition (< 5%).  Features were consistent with fatty liver disease (NASH or alcohol  related).  Ferritin has been followed: 171 on 09/14/2014, 480 on 12/30/2017, 1170 on 02/18/2018, and 314 on 04/05/2018.  Iron saturation was 32% on 09/14/2014, 59% on 12/30/2017, and 29% on 02/18/2018.  Sed rate was 16 (0-15) and CRP 3.8 (< 1.0) on 02/18/2018.  Work-up on 02/18/2018 revealed a hematocrit of 47.9, hemoglobin 16.5, MCV 95.8, platelets 198,000, WBC 4800 with an ANC of 2800.  Ferritin was 1170.  Iron saturation was 29% with a TIBC of 258.  Sed rate was 16 (0-15) and CRP 3.8 (< 1.0).  Hepatitis B core antibody was negative.    Abdomen and pelvic  CT on 12/11/2015 revealed hepatomegaly with extensive hepatic steatosis.  Ultrasound guided liver biopsy on 02/16/2018 revealed steatohepatitis, stage 0 (no fibrosis) with 90% steatosis (Brunt methodology).  There was minimal hepatocyte iron deposition (< 5%).  Features were consistent with fatty liver disease (NASH or alcohol  related). Wilson disease may be a consideration, with ceruloplasmin in the borderline range due to it being acute phase reactant (as may be the case with ferritin levels). Eye examination for Kayser-Fleischer rings and 24 hour urine copper test should be considered. If necessary, the liver biopsy tissue can be referred for quantitative copper testing.   He has polycythemia.  Hemoglobin has ranged between 16.2 - 18.1 from 10/01/2011 - 02/16/2018.  Negative studies on 02/18/2018 revealed epo level (9.9), carbon monoxide leve (2.9%), testosterone (175), O2 sats (96%), JAK2 V617F, exon 12-15, and MPL.  Patient has a history of TTP/HUS approximately 5-6 years ago.  His father required regular phlebotomies.  Symptomatically,  he feels "fine".  Exam is unremarkable.  Hemoglobin is 15.9.  Ferritin is 314.  Plan: 1.  Labs today:  CBC with diff, ferritin, CRP, sed rate. 2.  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   Iron saturation was normal (29%) on 02/18/2018.  Patients with HH typically have an iron saturation of > 45%.  Patient has discordant iron saturation and ferritin suggesting ferritin is likely an acute phase reactant.  Patient has a liver biopsy with no excess iron.   Concern raised by pathology re: consideration of Wilson's disease.  Patient seeing Dr Marius Ditch for follow-up. 3.  Erythrocytosis:  Hemoglobin was 16.5 (elevated) on 02/18/2018 and 15.9 (normal) today.  Work-up for polycythemia was negative. 4.  RTC in 2 months for MD assessment, labs (CBC with diff, ferritin, CRP-day before), and +/- phlebotomy.  Addendum:  Ferritin today is 314 without intervention confirming ferritin is an acute phase reactant.  CRP was 1.3.   Honor Loh, NP 04/05/2018, 1:37 PM   I saw and evaluated the patient, participating in the key portions of the service and reviewing pertinent diagnostic studies and records.  I reviewed the nurse practitioner's note and agree with the findings and the plan.  The assessment and plan were discussed with the patient.  Several questions were asked by the patient and answered.   Nolon Stalls, MD 04/05/2018,1:37 PM

## 2018-04-05 NOTE — Progress Notes (Signed)
Patient here for follow up

## 2018-05-11 ENCOUNTER — Ambulatory Visit: Payer: BLUE CROSS/BLUE SHIELD | Admitting: Gastroenterology

## 2018-05-11 ENCOUNTER — Encounter: Payer: Self-pay | Admitting: Gastroenterology

## 2018-05-11 DIAGNOSIS — K760 Fatty (change of) liver, not elsewhere classified: Secondary | ICD-10-CM

## 2018-05-11 NOTE — Progress Notes (Signed)
Cephas Darby, MD [place]  [place], [place] [address]  Main: [phone]  Fax: [phone]    Gastroenterology Consultation  Referring Provider:     Guadalupe Maple, MD Primary Care Physician:  Guadalupe Maple, MD Primary Gastroenterologist:  Dr. Cephas Darby Reason for Consultation:     Rectal bleeding, history of colon polyps, elevated LFTs        HPI:   MAURICO PERRELL is a 48 y.o. male referred by Dr. Guadalupe Maple, MD  for consultation & management of rectal bleeding and elevated LFTs  Rectal bleeding:Patient reports having one episode of bleeding per rectum every month ago which was self-limited. He experiences intermittent bleeding per rectum. He had a colonoscopy in 2016 and was found to have sessile serrated adenoma in cecum which was removed in piecemeal. The procedure report is not available with me today. He denies any other GI symptoms  Elevated LFTs: His found to have elevated transaminases, about twice upper limit of normal since 2017. On imaging, he was found to have diffuse hepatic steatosis in 2017. His hemoglobin is on the upper limit of normal between 16-18g/dl. He does not demonstrate any symptoms of chronic liver disease. 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 He also underwent colonoscopy which revealed subcentimeter tubular adenomas, removed.  I recommended liver biopsy and hematology referral.  However, patient canceled liver biopsy and wanted to discuss with me in office prior to proceeding with it.  He denies any complaints today  Follow-up visit 05/11/2018 Since last visit, patient completely eliminated red meat from his diet.  His weight has been stable.  He is working 2  full-time jobs.  He consumes chicken and [place] almost every day.  He is follow-up iron studies showed improvement in ferritin levels.  He underwent liver biopsy which revealed 90% steatosis but no evidence of steatohepatitis.  He did not undergo therapeutic phlebotomy  NSAIDs: none  Antiplts/Anticoagulants/Anti thrombotics: none Patient denies any known family history of GI malignancy Father with cancer, unknown type Father with hereditary hemochromatosis, receives phlebotomies periodically He denies any GI surgeries  GI Procedures:  Colonoscopy 01/07/2018 - Multiple ulcers in the terminal ileum. Biopsied. - Three 4 mm polyps in the rectum and in the descending colon, removed with a cold snare. Resected and Retrieved. - Nonbleeding internal hemorrhoids  DIAGNOSIS:  A. TERMINAL ILEUM ULCER; COLD BIOPSY:  - SMALL INTESTINAL MUCOSA WITH FOCAL REACTIVE LYMPHOID AGGREGATE AND  ADJACENT MILD ACTIVE ENTERITIS.  - NEGATIVE FOR VIRAL CYTOPATHIC EFFECT, ISCHEMIC CHANGE, DYSPLASIA, AND  MALIGNANCY.  - DEEPER SECTIONS EXAMINED.   B. TERMINAL ILEUM; COLD BIOPSY:  - UNREMARKABLE SMALL INTESTINAL MUCOSA.  - DEEPER SECTIONS EXAMINED.   C. COLON POLYP, DESCENDING; COLD SNARE:  - TUBULAR ADENOMA.  - NEGATIVE FOR HIGH-GRADE DYSPLASIA AND MALIGNANCY.   D. RECTUM POLYP X2; COLD SNARE:  - HYPERPLASTIC POLYP (2).  - NEGATIVE FOR DYSPLASIAAND MALIGNANCY.,   colonoscopy 2016 DIAGNOSIS:  A. COLON, CECAL POLYP; MUCOSAL BIOPSIES:  - FRAGMENTS OF SESSILE SERRATED ADENOMA.  - NO HIGH GRADE DYSPLASIA OR MALIGNANCY SEEN.   B. COLON, RANDOM; MUCOSAL BIOPSIES:  - WITHIN NORMAL LIMITS.  - NO FEATURES OF MICROSCOPIC/LYMPHOCYTIC OR COLLAGENOUS COLITIS  SEEN.  Colonoscopy 05/09/2010 Diagnosis:  CECUM POLYP COLD SNARE:  -  ONE 0.1 CM FRAGMENT CONSISTENT WITH HYPERPLASTIC POLYP, ADMIXED  WITH FECAL MATERIAL.    Past Medical History:  Diagnosis Date  . Abnormal LFTs   . Allergic rhinitis   .  Diverticulitis   . 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 Hyperplastic colon polyp   . Obesity   . Serrated adenoma of colon   . 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 COLONOSCOPY    . COLONOSCOPY WITH PROPOFOL N/A 01/07/2018   Procedure: COLONOSCOPY WITH PROPOFOL;  Surgeon: Lin Landsman, MD;  Location: [facility] ENDOSCOPY;  Service: Gastroenterology;  Laterality: N/A;  . SIGMOIDOSCOPY    . WISDOM TOOTH EXTRACTION      Current Outpatient Medications:  .  levocetirizine (XYZAL) 5 MG tablet, TAKE 1 TABLET (5 MG TOTAL) BY MOUTH EVERY EVENING., Disp: , Rfl:  .  Multiple Vitamin (MULTIVITAMIN) tablet, Take 1 tablet by mouth daily., Disp: , Rfl:     Family History  Problem Relation Age of Onset  . Lupus Mother   . Lung cancer Father   . Diabetes Mellitus I Father   . Cancer Father   . Osteoarthritis Maternal Grandmother   . Cancer Maternal Grandmother   . Osteoarthritis Maternal Grandfather   . Colon cancer Maternal Grandfather   . Osteoarthritis Paternal Grandmother   . Osteoarthritis Paternal Grandfather      Social History   Tobacco Use  . Smoking status: Never Smoker  . Smokeless tobacco: Current User    Types: Chew  Substance Use Topics  . Alcohol use: No  . Drug use: No    Allergies as of 05/11/2018 - Review Complete 05/11/2018  Allergen Reaction Noted  . Flonase [fluticasone propionate] Other (See Comments) 07/22/2017  . Sulfa antibiotics Rash 09/27/2013    Review of Systems:    All systems reviewed and negative except where noted in HPI.   Physical Exam:  BP (!) 135/94 (BP Location: Left Arm, Patient Position: Sitting, Cuff Size: Large)   Pulse 66   Resp 16   Ht 6' 1"  (1.854 m)   Wt 276 lb 3.2 oz (125.3 kg)   BMI 36.44 kg/m  No LMP for male patient.  General:   Alert,  Well-developed, well-nourished, pleasant and cooperative in NAD Head:  Normocephalic and atraumatic. Eyes:  Sclera clear, no icterus.    Conjunctiva pink. Ears:  Normal auditory acuity. Nose:  No deformity, discharge, or lesions. Mouth:  No deformity or lesions,oropharynx pink & moist. Neck:  Supple; no masses or thyromegaly. Lungs:  Respirations even and unlabored.  Clear throughout to auscultation.   No wheezes, crackles, or rhonchi. No acute distress. Heart:  Regular rate and rhythm; no murmurs, clicks, rubs, or gallops. Abdomen:  Normal bowel sounds. Soft, obese,non-tender and non-distended without masses, hepatosplenomegaly or hernias noted.  No guarding or rebound tenderness.   Rectal: Not performed Msk:  Symmetrical without gross deformities. Good, equal movement & strength bilaterally. Pulses:  Normal pulses noted. Extremities:  No clubbing or edema.  No cyanosis. Neurologic:  Alert and oriented x3;  grossly normal neurologically. Skin:  Intact without significant lesions or rashes. No jaundice. Psych:  Alert and cooperative. Normal mood and affect.  Imaging Studies: reviewed  Assessment and Plan:   MIKAELE STECHER is a 48 y.o. Caucasian male with obesity, BMI 35, history of colon adenoma, seen in consultation for rectal bleeding and chronically elevated LFTs  Elevated LFTs: Secondary to fatty liver leading to secondary  iron overload Performed secondary liver disease workup, elevated ferritin and iron saturation as well as single mutation carrier for [facility] gene C282Y I am not concerned about Wilson's disease.  Ceruloplasmin levels are normal. Encouraged him to continue healthy lifestyle, weight loss and exercise for management of fatty liver disease Will defer therapeutic phlebotomy at this time as ferritin levels are improving Recheck LFTs today  Tubular adenomas of the colon Recommend repeat colonoscopy in 5 years  Follow up in 3 months   Cephas Darby, MD

## 2018-05-12 LAB — HEPATIC FUNCTION PANEL
ALT: 107 IU/L — ABNORMAL HIGH (ref 0–44)
AST: 71 IU/L — ABNORMAL HIGH (ref 0–40)
Albumin: 4.6 g/dL (ref 4.0–5.0)
Alkaline Phosphatase: 85 IU/L (ref 39–117)
BILIRUBIN TOTAL: 0.4 mg/dL (ref 0.0–1.2)
BILIRUBIN, DIRECT: 0.13 mg/dL (ref 0.00–0.40)
Total Protein: 6.9 g/dL (ref 6.0–8.5)

## 2018-05-13 ENCOUNTER — Other Ambulatory Visit: Payer: Self-pay

## 2018-05-13 DIAGNOSIS — R748 Abnormal levels of other serum enzymes: Secondary | ICD-10-CM

## 2018-05-13 NOTE — Progress Notes (Signed)
Labs have been ordered, pt has been notified

## 2018-05-27 ENCOUNTER — Inpatient Hospital Stay: Payer: BLUE CROSS/BLUE SHIELD

## 2018-05-28 ENCOUNTER — Inpatient Hospital Stay: Payer: BLUE CROSS/BLUE SHIELD

## 2018-05-28 ENCOUNTER — Inpatient Hospital Stay: Payer: BLUE CROSS/BLUE SHIELD | Admitting: Hematology and Oncology

## 2018-06-08 ENCOUNTER — Encounter: Payer: Self-pay | Admitting: Hematology and Oncology

## 2018-08-03 ENCOUNTER — Ambulatory Visit: Payer: Self-pay | Admitting: Gastroenterology

## 2018-09-20 ENCOUNTER — Ambulatory Visit: Payer: Self-pay | Admitting: Gastroenterology

## 2018-10-06 DIAGNOSIS — Z03818 Encounter for observation for suspected exposure to other biological agents ruled out: Secondary | ICD-10-CM | POA: Diagnosis not present

## 2018-10-06 DIAGNOSIS — Z20828 Contact with and (suspected) exposure to other viral communicable diseases: Secondary | ICD-10-CM | POA: Diagnosis not present

## 2018-10-06 DIAGNOSIS — J029 Acute pharyngitis, unspecified: Secondary | ICD-10-CM | POA: Diagnosis not present

## 2018-10-06 DIAGNOSIS — R05 Cough: Secondary | ICD-10-CM | POA: Diagnosis not present

## 2018-10-07 ENCOUNTER — Telehealth: Payer: Self-pay

## 2018-10-07 NOTE — Telephone Encounter (Signed)
Received call from patient with c/o chills, GI upset, diarrhea, runny nose, a noticeable loss of taste. Patient stated he tested for COVID-19 on 10/06/18. Will follow accordingly.

## 2018-10-19 ENCOUNTER — Ambulatory Visit: Payer: Self-pay | Admitting: Gastroenterology

## 2018-10-19 ENCOUNTER — Other Ambulatory Visit: Payer: Self-pay

## 2018-10-19 ENCOUNTER — Encounter: Payer: Self-pay | Admitting: Gastroenterology

## 2018-10-21 ENCOUNTER — Other Ambulatory Visit: Payer: Self-pay

## 2018-10-21 ENCOUNTER — Other Ambulatory Visit: Payer: Self-pay | Admitting: Gastroenterology

## 2018-10-21 ENCOUNTER — Encounter: Payer: Self-pay | Admitting: Gastroenterology

## 2018-10-21 ENCOUNTER — Ambulatory Visit: Payer: BC Managed Care – PPO | Admitting: Gastroenterology

## 2018-10-21 ENCOUNTER — Encounter (INDEPENDENT_AMBULATORY_CARE_PROVIDER_SITE_OTHER): Payer: Self-pay

## 2018-10-21 VITALS — BP 116/88 | HR 101 | Temp 98.5°F | Resp 18 | Ht 73.0 in | Wt 271.6 lb

## 2018-10-21 DIAGNOSIS — K760 Fatty (change of) liver, not elsewhere classified: Secondary | ICD-10-CM | POA: Diagnosis not present

## 2018-10-21 DIAGNOSIS — R748 Abnormal levels of other serum enzymes: Secondary | ICD-10-CM

## 2018-10-21 DIAGNOSIS — R945 Abnormal results of liver function studies: Secondary | ICD-10-CM | POA: Diagnosis not present

## 2018-10-21 DIAGNOSIS — R7989 Other specified abnormal findings of blood chemistry: Secondary | ICD-10-CM

## 2018-10-21 NOTE — Progress Notes (Signed)
Cephas Darby, MD [place]  [place], [place] [address]  Main: [phone]  Fax: [phone]    Gastroenterology Consultation  Referring Provider:     Guadalupe Maple, MD Primary Care Physician:  Guadalupe Maple, MD Primary Gastroenterologist:  Dr. Cephas Darby Reason for Consultation:   History of colon polyps, elevated LFTs        HPI:   Ricky Mejia is a 48 y.o. male referred by Dr. Guadalupe Maple, MD  for consultation & management of rectal bleeding and elevated LFTs  Rectal bleeding:Patient reports having one episode of bleeding per rectum every month ago which was self-limited. He experiences intermittent bleeding per rectum. He had a colonoscopy in 2016 and was found to have sessile serrated adenoma in cecum which was removed in piecemeal. The procedure report is not available with me today. He denies any other GI symptoms  Elevated LFTs: His found to have elevated transaminases, about twice upper limit of normal since 2017. On imaging, he was found to have diffuse hepatic steatosis in 2017. His hemoglobin is on the upper limit of normal between 16-18g/dl. He does not demonstrate any symptoms of chronic liver disease. 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 He also underwent colonoscopy which revealed subcentimeter tubular adenomas, removed.  I recommended liver biopsy and hematology referral.  However, patient canceled liver biopsy and wanted to discuss with me in office prior to proceeding with it.  He denies any complaints today  Follow-up visit 05/11/2018 Since last visit, patient completely eliminated red meat from his diet.  His weight has been stable.  He is working 2 full-time jobs.  He  consumes chicken and [place] almost every day.  His follow-up iron studies showed improvement in ferritin levels.  He underwent liver biopsy which revealed 90% steatosis but no evidence of steatohepatitis.  He did not undergo therapeutic phlebotomy  Follow-up visit 10/21/2018 He denies any major GI complaints today.  He reports 2 weeks ago, he went to his PCP due to cough and sore throat, tested negative for COVID as well as strep throat.  He denies any respiratory symptoms today.  He does report mild bilateral upper quadrant pain, feels sore.  He reports exercising regularly, following healthy lifestyle  NSAIDs: none  Antiplts/Anticoagulants/Anti thrombotics: none Patient denies any known family history of GI malignancy Father with cancer, unknown type Father with hereditary hemochromatosis, receives phlebotomies periodically He denies any GI surgeries  GI Procedures:  Colonoscopy 01/07/2018 - Multiple ulcers in the terminal ileum. Biopsied. - Three 4 mm polyps in the rectum and in the descending colon, removed with a cold snare. Resected and Retrieved. - Nonbleeding internal hemorrhoids  DIAGNOSIS:  A. TERMINAL ILEUM ULCER; COLD BIOPSY:  - SMALL INTESTINAL MUCOSA WITH FOCAL REACTIVE LYMPHOID AGGREGATE AND  ADJACENT MILD ACTIVE ENTERITIS.  - NEGATIVE FOR VIRAL CYTOPATHIC EFFECT, ISCHEMIC CHANGE, DYSPLASIA, AND  MALIGNANCY.  - DEEPER SECTIONS EXAMINED.   B. TERMINAL ILEUM; COLD BIOPSY:  - UNREMARKABLE SMALL INTESTINAL MUCOSA.  - DEEPER SECTIONS EXAMINED.   C. COLON POLYP, DESCENDING; COLD SNARE:  - TUBULAR ADENOMA.  - NEGATIVE FOR HIGH-GRADE DYSPLASIA AND MALIGNANCY.   D. RECTUM POLYP X2; COLD SNARE:  - HYPERPLASTIC POLYP (2).  - NEGATIVE FOR DYSPLASIAAND MALIGNANCY.,  colonoscopy 2016 DIAGNOSIS:  A. COLON, CECAL POLYP; MUCOSAL BIOPSIES:  - FRAGMENTS OF SESSILE SERRATED ADENOMA.  - NO HIGH GRADE DYSPLASIA OR MALIGNANCY SEEN.   B. COLON, RANDOM; MUCOSAL BIOPSIES:  -  WITHIN NORMAL LIMITS.  - NO FEATURES OF MICROSCOPIC/LYMPHOCYTIC OR COLLAGENOUS COLITIS  SEEN.  Colonoscopy 05/09/2010 Diagnosis:  CECUM POLYP COLD SNARE:  - ONE 0.1 CM FRAGMENT CONSISTENT WITH HYPERPLASTIC POLYP, ADMIXED  WITH FECAL MATERIAL.    Past Medical History:  Diagnosis Date  . Abnormal LFTs   . Allergic rhinitis   . Diverticulitis   . 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 Hyperplastic colon polyp   . Obesity   . Serrated adenoma of colon   . 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 COLONOSCOPY    . COLONOSCOPY WITH PROPOFOL N/A 01/07/2018   Procedure: COLONOSCOPY WITH PROPOFOL;  Surgeon: Lin Landsman, MD;  Location: [facility] ENDOSCOPY;  Service: Gastroenterology;  Laterality: N/A;  . SIGMOIDOSCOPY    . WISDOM TOOTH EXTRACTION      Current Outpatient Medications:  .  azelastine (ASTELIN) 0.1 % nasal spray, , Disp: , Rfl:  .  benzonatate (TESSALON) 200 MG capsule, , Disp: , Rfl:  .  Multiple Vitamin (MULTIVITAMIN) tablet, Take 1 tablet by mouth daily., Disp: , Rfl:  .  doxycycline (VIBRAMYCIN) 100 MG capsule, , Disp: , Rfl:  .  levocetirizine (XYZAL) 5 MG tablet, TAKE 1 TABLET (5 MG TOTAL) BY MOUTH EVERY EVENING., Disp: , Rfl:     Family History  Problem Relation Age of Onset  . Lupus Mother   . Lung cancer Father   . Diabetes Mellitus I Father   . Cancer Father   . Osteoarthritis Maternal Grandmother   . Cancer Maternal Grandmother   . Osteoarthritis Maternal Grandfather   . Colon cancer Maternal Grandfather   . Osteoarthritis Paternal Grandmother   . Osteoarthritis Paternal Grandfather      Social History   Tobacco Use  . Smoking status: Never Smoker  . Smokeless tobacco: Current User    Types: Chew  Substance Use Topics  . Alcohol use: No  . Drug use: No    Allergies as of 10/21/2018 - Review Complete 10/21/2018  Allergen Reaction Noted  . Flonase [fluticasone propionate] Other (See Comments)  07/22/2017  . Sulfa antibiotics Rash 09/27/2013    Review of Systems:    All systems reviewed and negative except where noted in HPI.   Physical Exam:  BP 116/88 (BP Location: Left Arm, Patient Position: Sitting, Cuff Size: Large)   Pulse (!) 101   Temp 98.5 F (36.9 C)   Resp 18   Ht 6' 1"  (1.854 m)   Wt 271 lb 9.6 oz (123.2 kg)   BMI 35.83 kg/m  No LMP for male patient.  General:   Alert,  Well-developed, well-nourished, pleasant and cooperative in NAD Head:  Normocephalic and atraumatic. Eyes:  Sclera clear, no icterus.   Conjunctiva pink. Ears:  Normal auditory acuity. Nose:  No deformity, discharge, or lesions. Mouth:  No deformity or lesions,oropharynx pink & moist. Neck:  Supple; no masses or thyromegaly. Lungs:  Respirations even and unlabored.  Clear throughout to auscultation.   No wheezes, crackles, or rhonchi. No acute distress. Heart:  Regular rate and rhythm; no murmurs, clicks, rubs, or gallops. Abdomen:  Normal bowel sounds. Soft, obese,non-tender and non-distended without masses, hepatosplenomegaly or hernias noted.  No guarding or rebound tenderness.   Rectal:  Not performed Msk:  Symmetrical without gross deformities. Good, equal movement & strength bilaterally. Pulses:  Normal pulses noted. Extremities:  No clubbing or edema.  No cyanosis. Neurologic:  Alert and oriented x3;  grossly normal neurologically. Skin:  Intact without significant lesions or rashes. No jaundice. Psych:  Alert and cooperative. Normal mood and affect.  Imaging Studies: reviewed  Assessment and Plan:   Ricky Mejia is a 48 y.o. Caucasian male with obesity, BMI 35, history of colon adenoma, seen in consultation for rectal bleeding and chronically elevated LFTs  Elevated LFTs: Secondary to fatty liver leading to secondary iron overload Performed secondary liver disease workup, elevated ferritin and iron saturation as well as single mutation carrier for [facility] gene C282Y I am not  concerned about Wilson's disease.  Ceruloplasmin levels are normal. Encouraged him to continue healthy lifestyle, weight loss and exercise for management of fatty liver disease Will defer therapeutic phlebotomy at this time as ferritin levels are improving Recheck LFTs today Recommend right upper quadrant ultrasound to evaluate for liver and gallbladder  Tubular adenomas of the colon Recommend repeat colonoscopy in 12/2022  Follow up in 3-4 months   Cephas Darby, MD

## 2018-10-22 LAB — HEPATIC FUNCTION PANEL
ALT: 70 IU/L — ABNORMAL HIGH (ref 0–44)
AST: 43 IU/L — ABNORMAL HIGH (ref 0–40)
Albumin: 4.8 g/dL (ref 4.0–5.0)
Alkaline Phosphatase: 80 IU/L (ref 39–117)
Bilirubin Total: 0.4 mg/dL (ref 0.0–1.2)
Bilirubin, Direct: 0.14 mg/dL (ref 0.00–0.40)
Total Protein: 7.3 g/dL (ref 6.0–8.5)

## 2018-10-26 LAB — COPPER, URINE - RANDOM OR 24 HOUR
Copper / Creatinine Ratio: 8 ug/g creat (ref 0–49)
Copper, 24H Ur: 14 ug/24 hr (ref 3–35)
Copper, Ur: 12 ug/L
Creatinine(Crt),U: 1.53 g/L (ref 0.30–3.00)

## 2018-10-29 ENCOUNTER — Ambulatory Visit
Admission: RE | Admit: 2018-10-29 | Discharge: 2018-10-29 | Disposition: A | Discharge status: Home / Self Care | Payer: BC Managed Care – PPO | Source: Ambulatory Visit | Attending: Gastroenterology | Admitting: Gastroenterology

## 2018-10-29 ENCOUNTER — Other Ambulatory Visit: Payer: Self-pay

## 2018-10-29 DIAGNOSIS — R945 Abnormal results of liver function studies: Secondary | ICD-10-CM | POA: Diagnosis not present

## 2018-10-29 DIAGNOSIS — K760 Fatty (change of) liver, not elsewhere classified: Secondary | ICD-10-CM | POA: Diagnosis not present

## 2018-10-29 DIAGNOSIS — K7689 Other specified diseases of liver: Secondary | ICD-10-CM | POA: Diagnosis not present

## 2018-11-09 ENCOUNTER — Encounter: Payer: BLUE CROSS/BLUE SHIELD | Admitting: Family Medicine

## 2018-11-18 ENCOUNTER — Telehealth: Payer: Self-pay | Admitting: Family Medicine

## 2018-11-18 NOTE — Telephone Encounter (Signed)
Called pt to let him know that appt is on 8/10 at 8 no answer left vm

## 2018-11-22 ENCOUNTER — Encounter: Payer: Self-pay | Admitting: Family Medicine

## 2018-11-22 ENCOUNTER — Other Ambulatory Visit: Payer: Self-pay

## 2018-11-22 ENCOUNTER — Ambulatory Visit (INDEPENDENT_AMBULATORY_CARE_PROVIDER_SITE_OTHER): Payer: BC Managed Care – PPO | Admitting: Family Medicine

## 2018-11-22 VITALS — BP 134/87 | HR 91 | Temp 98.5°F | Ht 72.25 in | Wt 276.2 lb

## 2018-11-22 DIAGNOSIS — Z0000 Encounter for general adult medical examination without abnormal findings: Secondary | ICD-10-CM

## 2018-11-22 DIAGNOSIS — R1011 Right upper quadrant pain: Secondary | ICD-10-CM

## 2018-11-22 LAB — UA/M W/RFLX CULTURE, ROUTINE
Bilirubin, UA: NEGATIVE
Glucose, UA: NEGATIVE
Ketones, UA: NEGATIVE
Leukocytes,UA: NEGATIVE
Nitrite, UA: NEGATIVE
Protein,UA: NEGATIVE
RBC, UA: NEGATIVE
Specific Gravity, UA: >1.03 — ABNORMAL HIGH (ref 1.005–1.030)
Urobilinogen, Ur: 0.2 mg/dL (ref 0.2–1.0)
pH, UA: 5.5 (ref 5.0–7.5)

## 2018-11-22 NOTE — Progress Notes (Signed)
BP 134/87 (BP Location: Right Arm, Patient Position: Sitting, Cuff Size: Normal)   Pulse 91   Temp 98.5 F (36.9 C) (Oral)   Ht 6' 0.25" (1.835 m)   Wt 276 lb 3.2 oz (125.3 kg)   SpO2 96%   BMI 37.20 kg/m    Subjective:    Patient ID: Ricky Mejia, male    DOB: July 22, 1970, 48 y.o.   MRN: [id number]  HPI: Ricky Mejia is a 48 y.o. male presenting on 11/22/2018 for comprehensive medical examination. Current medical complaints include:none  Still having intermittent RUQ pain, this is being followed/worked up currently by GI with recent liver bx showing fatty liver disease.  He currently lives with: Interim Problems from his last visit: no  Depression Screen done today and results listed below:  Depression screen [facility] 2/9 11/22/2018 11/03/2017  Decreased Interest 0 0  Down, Depressed, Hopeless 0 0  PHQ - 2 Score 0 0  Altered sleeping 1 0  Tired, decreased energy 1 1  Change in appetite 0 0  Feeling bad or failure about yourself  0 0  Trouble concentrating 1 0  Moving slowly or fidgety/restless 0 0  Suicidal thoughts 0 0  PHQ-9 Score 3 1    The patient does not have a history of falls. I did complete a risk assessment for falls. A plan of care for falls was documented.   Past Medical History:  Past Medical History:  Diagnosis Date  . Abnormal LFTs   . Allergic rhinitis   . Diverticulitis   . 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 Hyperplastic colon polyp   . Obesity   . Serrated adenoma of colon   . 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 COLONOSCOPY    . COLONOSCOPY WITH PROPOFOL N/A 01/07/2018   Procedure: COLONOSCOPY WITH PROPOFOL;  Surgeon: Lin Landsman, MD;  Location: [facility] ENDOSCOPY;  Service: Gastroenterology;  Laterality: N/A;  . SIGMOIDOSCOPY    . WISDOM TOOTH EXTRACTION      Medications:  Current Outpatient Medications on File Prior to Visit  Medication Sig  . azelastine  (ASTELIN) 0.1 % nasal spray   . cetirizine (ZYRTEC) 10 MG tablet Take 10 mg by mouth daily.  . Multiple Vitamin (MULTIVITAMIN) tablet Take 1 tablet by mouth daily.   No current facility-administered medications on file prior to visit.     Allergies:  Allergies  Allergen Reactions  . Flonase [Fluticasone Propionate] Other (See Comments)    nosebleeds  . Sulfa Antibiotics Rash    Social History:  Social History   Socioeconomic History  . Marital status: Divorced    Spouse name: Not on file  . Number of children: Not on file  . Years of education: Not on file  . Highest education level: Not on file  Occupational History  . Not on file  Social Needs  . Financial resource strain: Not on file  . Food insecurity    Worry: Not on file    Inability: Not on file  . Transportation needs    Medical: Not on file    Non-medical: Not on file  Tobacco Use  . Smoking status: Never Smoker  . Smokeless tobacco: Current User    Types: Chew  Substance and Sexual Activity  . Alcohol use: No  . Drug use: No  . Sexual activity: Not on file  Lifestyle  . Physical activity  Days per week: Not on file    Minutes per session: Not on file  . Stress: Not on file  Relationships  . Social Herbalist on phone: Not on file    Gets together: Not on file    Attends religious service: Not on file    Active member of club or organization: Not on file    Attends meetings of clubs or organizations: Not on file    Relationship status: Not on file  . Intimate partner violence    Fear of current or ex partner: Not on file    Emotionally abused: Not on file    Physically abused: Not on file    Forced sexual activity: Not on file  Other Topics Concern  . Not on file  Social History Narrative  . Not on file   Social History   Tobacco Use  Smoking Status Never Smoker  Smokeless Tobacco Current User  . Types: Chew   Social History   Substance and Sexual Activity  Alcohol Use No     Family History:  Family History  Problem Relation Age of Onset  . Lupus Mother   . Lung cancer Father   . Diabetes Mellitus I Father   . Cancer Father   . Osteoarthritis Maternal Grandmother   . Cancer Maternal Grandmother   . Osteoarthritis Maternal Grandfather   . Colon cancer Maternal Grandfather   . Osteoarthritis Paternal Grandmother   . Osteoarthritis Paternal Grandfather     Past medical history, surgical history, medications, allergies, family history and social history reviewed with patient today and changes made to appropriate areas of the chart.   Review of Systems - General ROS: negative Psychological ROS: negative Ophthalmic ROS: negative ENT ROS: negative Allergy and Immunology ROS: negative Hematological and Lymphatic ROS: negative Endocrine ROS: negative Breast ROS: negative for breast lumps Respiratory ROS: no cough, shortness of breath, or wheezing Cardiovascular ROS: no chest pain or dyspnea on exertion Gastrointestinal ROS: positive for - abdominal pain Genito-Urinary ROS: no dysuria, trouble voiding, or hematuria Musculoskeletal ROS: negative Neurological ROS: no TIA or stroke symptoms Dermatological ROS: negative All other ROS negative except what is listed above and in the HPI.      Objective:    BP 134/87 (BP Location: Right Arm, Patient Position: Sitting, Cuff Size: Normal)   Pulse 91   Temp 98.5 F (36.9 C) (Oral)   Ht 6' 0.25" (1.835 m)   Wt 276 lb 3.2 oz (125.3 kg)   SpO2 96%   BMI 37.20 kg/m   Wt Readings from Last 3 Encounters:  11/22/18 276 lb 3.2 oz (125.3 kg)  10/21/18 271 lb 9.6 oz (123.2 kg)  05/11/18 276 lb 3.2 oz (125.3 kg)    Physical Exam Vitals signs and nursing note reviewed.  Constitutional:      General: He is not in acute distress.    Appearance: He is well-developed.  HENT:     Head: Atraumatic.     Right Ear: Tympanic membrane and external ear normal.     Left Ear: Tympanic membrane and external ear normal.      Nose: Nose normal.     Mouth/Throat:     Mouth: Mucous membranes are moist.     Pharynx: Oropharynx is clear.  Eyes:     General: No scleral icterus.    Conjunctiva/sclera: Conjunctivae normal.     Pupils: Pupils are equal, round, and reactive to light.  Neck:     Musculoskeletal:  Normal range of motion and neck supple.  Cardiovascular:     Rate and Rhythm: Normal rate and regular rhythm.     Heart sounds: Normal heart sounds. No murmur.  Pulmonary:     Effort: Pulmonary effort is normal. No respiratory distress.     Breath sounds: Normal breath sounds.  Abdominal:     General: Bowel sounds are normal. There is no distension.     Palpations: Abdomen is soft. There is no mass.     Tenderness: There is no abdominal tenderness. There is no guarding.  Genitourinary:    Comments: GU exam declined Musculoskeletal: Normal range of motion.        General: No tenderness.  Skin:    General: Skin is warm and dry.     Findings: No rash.  Neurological:     General: No focal deficit present.     Mental Status: He is alert and oriented to person, place, and time.     Deep Tendon Reflexes: Reflexes are normal and symmetric.  Psychiatric:        Mood and Affect: Mood normal.        Behavior: Behavior normal.        Thought Content: Thought content normal.        Judgment: Judgment normal.     Results for orders placed or performed in visit on 11/22/18  CBC with Differential/Platelet  Result Value Ref Range   WBC 6.3 3.4 - 10.8 x10E3/uL   RBC 5.02 4.14 - 5.80 x10E6/uL   Hemoglobin 16.5 13.0 - 17.7 g/dL   Hematocrit 46.6 37.5 - 51.0 %   MCV 93 79 - 97 fL   MCH 32.9 26.6 - 33.0 pg   MCHC 35.4 31.5 - 35.7 g/dL   RDW 13.1 11.6 - 15.4 %   Platelets 218 150 - 450 x10E3/uL   Neutrophils 58 Not Estab. %   Lymphs 29 Not Estab. %   Monocytes 9 Not Estab. %   Eos 2 Not Estab. %   Basos 1 Not Estab. %   Neutrophils Absolute 3.7 1.4 - 7.0 x10E3/uL   Lymphocytes Absolute 1.8 0.7 - 3.1  x10E3/uL   Monocytes Absolute 0.6 0.1 - 0.9 x10E3/uL   EOS (ABSOLUTE) 0.1 0.0 - 0.4 x10E3/uL   Basophils Absolute 0.1 0.0 - 0.2 x10E3/uL   Immature Granulocytes 1 Not Estab. %   Immature Grans (Abs) 0.0 0.0 - 0.1 x10E3/uL  Comprehensive metabolic panel  Result Value Ref Range   Glucose 94 65 - 99 mg/dL   BUN 11 6 - 24 mg/dL   Creatinine, Ser 0.91 0.76 - 1.27 mg/dL   GFR calc non Af Amer 101 >59 mL/min/1.73   GFR calc Af Amer 116 >59 mL/min/1.73   BUN/Creatinine Ratio 12 9 - 20   Sodium 144 134 - 144 mmol/L   Potassium 3.9 3.5 - 5.2 mmol/L   Chloride 104 96 - 106 mmol/L   CO2 25 20 - 29 mmol/L   Calcium 9.5 8.7 - 10.2 mg/dL   Total Protein 6.7 6.0 - 8.5 g/dL   Albumin 4.7 4.0 - 5.0 g/dL   Globulin, Total 2.0 1.5 - 4.5 g/dL   Albumin/Globulin Ratio 2.4 (H) 1.2 - 2.2   Bilirubin Total 0.4 0.0 - 1.2 mg/dL   Alkaline Phosphatase 80 39 - 117 IU/L   AST 46 (H) 0 - 40 IU/L   ALT 64 (H) 0 - 44 IU/L  Lipid Panel w/o Chol/HDL Ratio  Result Value Ref Range  Cholesterol, Total 180 100 - 199 mg/dL   Triglycerides 238 (H) 0 - 149 mg/dL   HDL 39 (L) >39 mg/dL   VLDL Cholesterol Cal 48 (H) 5 - 40 mg/dL   LDL Calculated 93 0 - 99 mg/dL  UA/M w/rflx Culture, Routine   Specimen: Urine   URINE  Result Value Ref Range   Specific Gravity, UA >1.030 (H) 1.005 - 1.030   pH, UA 5.5 5.0 - 7.5   Color, UA Yellow Yellow   Appearance Ur Clear Clear   Leukocytes,UA Negative Negative   Protein,UA Negative Negative/Trace   Glucose, UA Negative Negative   Ketones, UA Negative Negative   RBC, UA Negative Negative   Bilirubin, UA Negative Negative   Urobilinogen, Ur 0.2 0.2 - 1.0 mg/dL   Nitrite, UA Negative Negative      Assessment & Plan:   Problem List Items Addressed This Visit    None    Visit Diagnoses    Annual physical exam    -  Primary   Relevant Orders   CBC with Differential/Platelet (Completed)   Comprehensive metabolic panel (Completed)   Lipid Panel w/o Chol/HDL Ratio  (Completed)   UA/M w/rflx Culture, Routine (Completed)   RUQ pain       Followed by GI, continue management per their recommendations       Discussed aspirin prophylaxis for myocardial infarction prevention and decision was it was not indicated  LABORATORY TESTING:  Health maintenance labs ordered today as discussed above.   The natural history of prostate cancer and ongoing controversy regarding screening and potential treatment outcomes of prostate cancer has been discussed with the patient. The meaning of a false positive PSA and a false negative PSA has been discussed. He indicates understanding of the limitations of this screening test and wishes not to proceed with screening PSA testing.   IMMUNIZATIONS:   - Tdap: Tetanus vaccination status reviewed: last tetanus booster within 10 years. - Influenza: Postponed to flu season  PATIENT COUNSELING:    Sexuality: Discussed sexually transmitted diseases, partner selection, use of condoms, avoidance of unintended pregnancy  and contraceptive alternatives.   Advised to avoid cigarette smoking.  I discussed with the patient that most people either abstain from alcohol or drink within safe limits (<=14/week and <=4 drinks/occasion for males, <=7/weeks and <= 3 drinks/occasion for females) and that the risk for alcohol disorders and other health effects rises proportionally with the number of drinks per week and how often a drinker exceeds daily limits.  Discussed cessation/primary prevention of drug use and availability of treatment for abuse.   Diet: Encouraged to adjust caloric intake to maintain  or achieve ideal body weight, to reduce intake of dietary saturated fat and total fat, to limit sodium intake by avoiding high sodium foods and not adding table salt, and to maintain adequate dietary potassium and calcium preferably from fresh fruits, vegetables, and low-fat dairy products.    stressed the importance of regular exercise   Injury prevention: Discussed safety belts, safety helmets, smoke detector, smoking near bedding or upholstery.   Dental health: Discussed importance of regular tooth brushing, flossing, and dental visits.   Follow up plan: NEXT PREVENTATIVE PHYSICAL DUE IN 1 YEAR. Return in about 1 year (around 11/22/2019) for CPE.

## 2018-11-23 LAB — CBC WITH DIFFERENTIAL/PLATELET
Basophils Absolute: 0.1 10*3/uL (ref 0.0–0.2)
Basos: 1 %
EOS (ABSOLUTE): 0.1 10*3/uL (ref 0.0–0.4)
Eos: 2 %
Hematocrit: 46.6 % (ref 37.5–51.0)
Hemoglobin: 16.5 g/dL (ref 13.0–17.7)
Immature Grans (Abs): 0 10*3/uL (ref 0.0–0.1)
Immature Granulocytes: 1 %
Lymphocytes Absolute: 1.8 10*3/uL (ref 0.7–3.1)
Lymphs: 29 %
MCH: 32.9 pg (ref 26.6–33.0)
MCHC: 35.4 g/dL (ref 31.5–35.7)
MCV: 93 fL (ref 79–97)
Monocytes Absolute: 0.6 10*3/uL (ref 0.1–0.9)
Monocytes: 9 %
Neutrophils Absolute: 3.7 10*3/uL (ref 1.4–7.0)
Neutrophils: 58 %
Platelets: 218 10*3/uL (ref 150–450)
RBC: 5.02 x10E6/uL (ref 4.14–5.80)
RDW: 13.1 % (ref 11.6–15.4)
WBC: 6.3 10*3/uL (ref 3.4–10.8)

## 2018-11-23 LAB — LIPID PANEL W/O CHOL/HDL RATIO
Cholesterol, Total: 180 mg/dL (ref 100–199)
HDL: 39 mg/dL — ABNORMAL LOW (ref >39)
LDL Calculated: 93 mg/dL (ref 0–99)
Triglycerides: 238 mg/dL — ABNORMAL HIGH (ref 0–149)
VLDL Cholesterol Cal: 48 mg/dL — ABNORMAL HIGH (ref 5–40)

## 2018-11-23 LAB — COMPREHENSIVE METABOLIC PANEL
ALT: 64 IU/L — ABNORMAL HIGH (ref 0–44)
AST: 46 IU/L — ABNORMAL HIGH (ref 0–40)
Albumin/Globulin Ratio: 2.4 — ABNORMAL HIGH (ref 1.2–2.2)
Albumin: 4.7 g/dL (ref 4.0–5.0)
Alkaline Phosphatase: 80 IU/L (ref 39–117)
BUN/Creatinine Ratio: 12 (ref 9–20)
BUN: 11 mg/dL (ref 6–24)
Bilirubin Total: 0.4 mg/dL (ref 0.0–1.2)
CO2: 25 mmol/L (ref 20–29)
Calcium: 9.5 mg/dL (ref 8.7–10.2)
Chloride: 104 mmol/L (ref 96–106)
Creatinine, Ser: 0.91 mg/dL (ref 0.76–1.27)
GFR calc Af Amer: 116 mL/min/{1.73_m2} (ref >59)
GFR calc non Af Amer: 101 mL/min/{1.73_m2} (ref >59)
Globulin, Total: 2 g/dL (ref 1.5–4.5)
Glucose: 94 mg/dL (ref 65–99)
Potassium: 3.9 mmol/L (ref 3.5–5.2)
Sodium: 144 mmol/L (ref 134–144)
Total Protein: 6.7 g/dL (ref 6.0–8.5)

## 2018-11-24 ENCOUNTER — Encounter: Payer: Self-pay | Admitting: Family Medicine

## 2019-01-14 ENCOUNTER — Encounter: Payer: Self-pay | Admitting: Family Medicine

## 2019-02-21 ENCOUNTER — Ambulatory Visit: Payer: BC Managed Care – PPO | Admitting: Gastroenterology

## 2020-09-13 ENCOUNTER — Emergency Department: Payer: Managed Care, Other (non HMO)

## 2020-09-13 ENCOUNTER — Emergency Department
Admission: EM | Admit: 2020-09-13 | Discharge: 2020-09-13 | Disposition: A | Discharge status: Home / Self Care | Payer: Managed Care, Other (non HMO) | Attending: Emergency Medicine | Admitting: Emergency Medicine

## 2020-09-13 ENCOUNTER — Encounter: Payer: Self-pay | Admitting: Emergency Medicine

## 2020-09-13 ENCOUNTER — Other Ambulatory Visit: Payer: Self-pay

## 2020-09-13 DIAGNOSIS — Z79899 Other long term (current) drug therapy: Secondary | ICD-10-CM | POA: Insufficient documentation

## 2020-09-13 DIAGNOSIS — R42 Dizziness and giddiness: Secondary | ICD-10-CM | POA: Diagnosis not present

## 2020-09-13 DIAGNOSIS — T675XXA Heat exhaustion, unspecified, initial encounter: Secondary | ICD-10-CM | POA: Insufficient documentation

## 2020-09-13 DIAGNOSIS — X30XXXA Exposure to excessive natural heat, initial encounter: Secondary | ICD-10-CM | POA: Diagnosis not present

## 2020-09-13 DIAGNOSIS — R1011 Right upper quadrant pain: Secondary | ICD-10-CM

## 2020-09-13 DIAGNOSIS — I10 Essential (primary) hypertension: Secondary | ICD-10-CM | POA: Insufficient documentation

## 2020-09-13 DIAGNOSIS — R079 Chest pain, unspecified: Secondary | ICD-10-CM | POA: Diagnosis not present

## 2020-09-13 DIAGNOSIS — K7689 Other specified diseases of liver: Secondary | ICD-10-CM | POA: Insufficient documentation

## 2020-09-13 DIAGNOSIS — Z28310 Unvaccinated for covid-19: Secondary | ICD-10-CM | POA: Insufficient documentation

## 2020-09-13 DIAGNOSIS — R0602 Shortness of breath: Secondary | ICD-10-CM | POA: Diagnosis not present

## 2020-09-13 DIAGNOSIS — K769 Liver disease, unspecified: Secondary | ICD-10-CM

## 2020-09-13 LAB — URINALYSIS, COMPLETE (UACMP) WITH MICROSCOPIC
Bacteria, UA: NONE SEEN
Bilirubin Urine: NEGATIVE
Glucose, UA: NEGATIVE mg/dL
Hgb urine dipstick: NEGATIVE
Ketones, ur: NEGATIVE mg/dL
Leukocytes,Ua: NEGATIVE
Nitrite: NEGATIVE
Protein, ur: NEGATIVE mg/dL
Specific Gravity, Urine: 1.019 (ref 1.005–1.030)
Squamous Epithelial / HPF: NONE SEEN (ref 0–5)
pH: 6 (ref 5.0–8.0)

## 2020-09-13 LAB — TROPONIN I (HIGH SENSITIVITY)
Troponin I (High Sensitivity): 15 ng/L (ref <18)
Troponin I (High Sensitivity): 14 ng/L (ref <18)

## 2020-09-13 LAB — HEPATIC FUNCTION PANEL
ALT: 99 U/L — ABNORMAL HIGH (ref 0–44)
AST: 75 U/L — ABNORMAL HIGH (ref 15–41)
Albumin: 4.4 g/dL (ref 3.5–5.0)
Alkaline Phosphatase: 75 U/L (ref 38–126)
Bilirubin, Direct: <0.1 mg/dL (ref 0.0–0.2)
Total Bilirubin: 0.7 mg/dL (ref 0.3–1.2)
Total Protein: 7.4 g/dL (ref 6.5–8.1)

## 2020-09-13 LAB — BASIC METABOLIC PANEL
Anion gap: 11 (ref 5–15)
BUN: 16 mg/dL (ref 6–20)
CO2: 23 mmol/L (ref 22–32)
Calcium: 9.8 mg/dL (ref 8.9–10.3)
Chloride: 106 mmol/L (ref 98–111)
Creatinine, Ser: 1.09 mg/dL (ref 0.61–1.24)
GFR, Estimated: >60 mL/min (ref >60)
Glucose, Bld: 168 mg/dL — ABNORMAL HIGH (ref 70–99)
Potassium: 3.7 mmol/L (ref 3.5–5.1)
Sodium: 140 mmol/L (ref 135–145)

## 2020-09-13 LAB — CBC
HCT: 45.9 % (ref 39.0–52.0)
Hemoglobin: 16.7 g/dL (ref 13.0–17.0)
MCH: 32.9 pg (ref 26.0–34.0)
MCHC: 36.4 g/dL — ABNORMAL HIGH (ref 30.0–36.0)
MCV: 90.4 fL (ref 80.0–100.0)
Platelets: 219 10*3/uL (ref 150–400)
RBC: 5.08 MIL/uL (ref 4.22–5.81)
RDW: 12.4 % (ref 11.5–15.5)
WBC: 6.7 10*3/uL (ref 4.0–10.5)
nRBC: 0 % (ref 0.0–0.2)

## 2020-09-13 LAB — BRAIN NATRIURETIC PEPTIDE: B Natriuretic Peptide: 11.3 pg/mL (ref 0.0–100.0)

## 2020-09-13 LAB — CK: Total CK: 485 U/L — ABNORMAL HIGH (ref 49–397)

## 2020-09-13 LAB — D-DIMER, QUANTITATIVE: D-Dimer, Quant: 0.55 ug/mL-FEU — ABNORMAL HIGH (ref 0.00–0.50)

## 2020-09-13 MED ORDER — IOHEXOL 350 MG/ML SOLN
75.0000 mL | Freq: Once | INTRAVENOUS | Status: AC | PRN
  Administered 2020-09-13: 75 mL via INTRAVENOUS
  Filled 2020-09-13: qty 75

## 2020-09-13 MED ORDER — SODIUM CHLORIDE 0.9 % IV BOLUS
1000.0000 mL | Freq: Once | INTRAVENOUS | Status: AC
  Administered 2020-09-13: 1000 mL via INTRAVENOUS

## 2020-09-13 MED ORDER — AMLODIPINE BESYLATE 5 MG PO TABS: 5.0000 mg | ORAL_TABLET | Freq: Every day | ORAL | 11 refills | Status: DC

## 2020-09-13 NOTE — ED Provider Notes (Signed)
[facility] Emergency Department Provider Note  ____________________________________________   Event Date/Time   First MD Initiated Contact with Patient 09/13/20 1200     (approximate)  I have reviewed the triage vital signs and the nursing notes.   HISTORY  Chief Complaint Chest Pain    HPI Ricky Mejia is a 50 y.o. male presents emergency department complaining of right-sided chest pain for 4 to 5 days.  States he is having pain that radiates into his back and has become dizzy and short of breath.  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  He states his legs are swelling.  Patient has history of TTP.  He denies any fever or chills.  No recent COVID.  Patient is not vaccinated for COVID.    Past Medical History:  Diagnosis Date  . Abnormal LFTs   . Allergic rhinitis   . Diverticulitis   . 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 Hyperplastic colon polyp   . Obesity   . Serrated adenoma of colon   . T.T.P. syndrome     Patient Active Problem List   Diagnosis Date Noted  . Fatty liver determined by biopsy 10/21/2018  . Hx of adenomatous colonic polyps   . Obesity 12/29/2017  . Eczema 11/03/2017  . Benign hypertension 11/03/2017  . Allergic rhinitis 07/24/2017  . Abnormal LFTs (liver function tests) 09/14/2014    Past Surgical History:  Procedure Laterality Date  . COLONOSCOPY    . COLONOSCOPY WITH PROPOFOL N/A 01/07/2018   Procedure: COLONOSCOPY WITH PROPOFOL;  Surgeon: Lin Landsman, MD;  Location: [facility] ENDOSCOPY;  Service: Gastroenterology;  Laterality: N/A;  . SIGMOIDOSCOPY    . WISDOM TOOTH EXTRACTION      Prior to Admission medications   Medication Sig Start Date End Date Taking? Authorizing Provider  amLODipine (NORVASC) 5 MG tablet Take 1 tablet (5 mg total) by mouth daily. 09/13/20 09/13/21 Yes Makaelah Cranfield, Linden Dolin, PA-C  azelastine (ASTELIN) 0.1 % nasal spray  10/06/18    [provider]  cetirizine (ZYRTEC) 10 MG tablet Take 10 mg by mouth daily.    [provider]  Multiple Vitamin (MULTIVITAMIN) tablet Take 1 tablet by mouth daily.    [provider]    Allergies Flonase [fluticasone propionate] and Sulfa antibiotics  Family History  Problem Relation Age of Onset  . Lupus Mother   . Lung cancer Father   . Diabetes Mellitus I Father   . Cancer Father   . Osteoarthritis Maternal Grandmother   . Cancer Maternal Grandmother   . Osteoarthritis Maternal Grandfather   . Colon cancer Maternal Grandfather   . Osteoarthritis Paternal Grandmother   . Osteoarthritis Paternal Grandfather     Social History Social History   Tobacco Use  . Smoking status: Never Smoker  . Smokeless tobacco: Current User    Types: Chew  Vaping Use  . Vaping Use: Never used  Substance Use Topics  . Alcohol use: No  . Drug use: No    Review of Systems  Constitutional: No fever/chills Eyes: No visual changes. ENT: No sore throat. Respiratory: Denies cough Cardiovascular: Positive chest pain Gastrointestinal: Denies abdominal pain Genitourinary: Negative for dysuria. Musculoskeletal: Negative for back pain. Skin: Negative for rash. Psychiatric: no mood changes,     ____________________________________________   PHYSICAL EXAM:  VITAL SIGNS: ED Triage Vitals [09/13/20 1037]  Enc Vitals Group     BP (!) 181/111  Pulse Rate (!) 115     Resp 18     Temp 98.6 F (37 C)     Temp Source Oral     SpO2 95 %     Weight 260 lb (117.9 kg)     Height 6' 1"  (1.854 m)     Head Circumference      Peak Flow      Pain Score 8     Pain Loc      Pain Edu?      Excl. in [place]?     Constitutional: Alert and oriented. Well appearing and in no acute distress. Eyes: Conjunctivae are normal.  Head: Atraumatic. Nose: No congestion/rhinnorhea. Mouth/Throat: Mucous membranes are moist.   Neck:  supple no lymphadenopathy  noted Cardiovascular: Tachycardic, regular rhythm. Heart sounds are normal Respiratory: Normal respiratory effort.  No retractions, lungs c t a  Abd: soft tender in the right upper quadrant, bs normal all 4 quad GU: deferred Musculoskeletal: FROM all extremities, warm and well perfused Neurologic:  Normal speech and language.  Skin:  Skin is warm, dry and intact. No rash noted. Psychiatric: Mood and affect are normal. Speech and behavior are normal.  ____________________________________________   LABS (all labs ordered are listed, but only abnormal results are displayed)  Labs Reviewed  BASIC METABOLIC PANEL - Abnormal; Notable for the following components:      Result Value   Glucose, Bld 168 (*)    All other components within normal limits  CBC - Abnormal; Notable for the following components:   MCHC 36.4 (*)    All other components within normal limits  HEPATIC FUNCTION PANEL - Abnormal; Notable for the following components:   AST 75 (*)    ALT 99 (*)    All other components within normal limits  D-DIMER, QUANTITATIVE - Abnormal; Notable for the following components:   D-Dimer, Quant 0.55 (*)    All other components within normal limits  CK - Abnormal; Notable for the following components:   Total CK 485 (*)    All other components within normal limits  URINALYSIS, COMPLETE (UACMP) WITH MICROSCOPIC - Abnormal; Notable for the following components:   Color, Urine YELLOW (*)    APPearance CLEAR (*)    All other components within normal limits  BRAIN NATRIURETIC PEPTIDE  TROPONIN I (HIGH SENSITIVITY)  TROPONIN I (HIGH SENSITIVITY)   ____________________________________________   ____________________________________________  RADIOLOGY  Chest x-ray Ultrasound right upper quadrant CT for PE  ____________________________________________   PROCEDURES  Procedure(s) performed: EKG shows sinus tachycardia, see physician  read  Procedures    ____________________________________________   INITIAL IMPRESSION / ASSESSMENT AND PLAN / ED COURSE  Pertinent labs & imaging results that were available during my care of the patient were reviewed by me and considered in my medical decision making (see chart for details).   Patient's 50 year old male presents with right-sided chest pain.  See HPI.  exam shows patient be tachycardic with elevated blood pressure but appears to be stable.  DDx: CHF, MI, PE, heat exhaustion, acute cholecystitis,  Initial labs from triage are reassuring, CBC and metabolic panel normal, troponin is normal  Due to the patient's complaints along with being tachycardic and hypertensive I did add several labs,  Chest x-ray reviewed by me confirmed by radiology to be normal Ultrasound right upper quadrant  Will consider CT for PE if D-dimer is elevated.    ----------------------------------------- 1:00 PM on 09/13/2020 -----------------------------------------  D-dimer is elevated, CT for PE ordered  Ultrasound right upper quadrant shows gallstones but no acute cholecystitis  CT for PE does not show PE, however it does show a liver lesion which is concerning for cyst or malignancy.  Recommended MRI for further evaluation.  I did explain all the findings to the patient.  He is to follow-up with Dr. Marius Ditch concerning the liver.  Follow-up with the family doctor concerning his blood pressure.  I did start him on amlodipine 5 mg daily.  Explained to him that heat exhaustion is from working in the unventilated area and not drinking enough water.  Hypertension measures were discussed.  Diet and exercise were discussed.  He was discharged in stable condition.  Ricky Mejia was evaluated in Emergency Department on 09/13/2020 for the symptoms described in the history of present illness. He was evaluated in the context of the global COVID-19 pandemic, which necessitated consideration that the  patient might be at risk for infection with the SARS-CoV-2 virus that causes COVID-19. Institutional protocols and algorithms that pertain to the evaluation of patients at risk for COVID-19 are in a state of rapid change based on information released by regulatory bodies including the CDC and federal and state organizations. These policies and algorithms were followed during the patient's care in the ED.    As part of my medical decision making, I reviewed the following data within the [place] History obtained from family, Nursing notes reviewed and incorporated, Labs reviewed , EKG interpreted tachycardia, Old chart reviewed, Radiograph reviewed , Notes from prior ED visits and [place] Controlled Substance Database  ____________________________________________   FINAL CLINICAL IMPRESSION(S) / ED DIAGNOSES  Final diagnoses:  RUQ pain  Heat exhaustion, initial encounter  Nonspecific chest pain  Liver lesion      NEW MEDICATIONS STARTED DURING THIS VISIT:  New Prescriptions   AMLODIPINE (NORVASC) 5 MG TABLET    Take 1 tablet (5 mg total) by mouth daily.     Note:  This document was prepared using Dragon voice recognition software and may include unintentional dictation errors.    Versie Starks, PA-C 09/13/20 1415    Duffy Bruce, MD 09/19/20 1037

## 2020-09-13 NOTE — ED Notes (Signed)
US at bedside

## 2020-09-13 NOTE — Discharge Instructions (Signed)
Drink plenty of water while at work. Follow-up with a family physician concerning your blood pressure Return the emergency department if worsening Follow-up with Dr. Marius Ditch to discuss the liver lesion.

## 2020-09-13 NOTE — ED Notes (Signed)
Patient transported to CT 

## 2020-09-13 NOTE — ED Triage Notes (Signed)
Pt comes into the ED via POV c/o right side chest pain that radiates into the back and is accompanied with dizziness and sHOB.  Pt denies any known cardiac history.  Pt states the pain started on Sunday.  Pt ambulatory to triage at this time with even and unlabored respirations.

## 2021-03-12 ENCOUNTER — Ambulatory Visit: Payer: Managed Care, Other (non HMO) | Admitting: Family Medicine

## 2021-03-12 ENCOUNTER — Other Ambulatory Visit: Payer: Self-pay | Admitting: Family Medicine

## 2021-03-12 ENCOUNTER — Encounter: Payer: Self-pay | Admitting: Family Medicine

## 2021-03-12 ENCOUNTER — Other Ambulatory Visit: Payer: Self-pay

## 2021-03-12 VITALS — BP 124/92 | HR 109 | Ht 73.0 in | Wt 288.6 lb

## 2021-03-12 DIAGNOSIS — Z7689 Persons encountering health services in other specified circumstances: Secondary | ICD-10-CM | POA: Diagnosis not present

## 2021-03-12 DIAGNOSIS — I10 Essential (primary) hypertension: Secondary | ICD-10-CM | POA: Diagnosis not present

## 2021-03-12 DIAGNOSIS — Z720 Tobacco use: Secondary | ICD-10-CM

## 2021-03-12 DIAGNOSIS — R7989 Other specified abnormal findings of blood chemistry: Secondary | ICD-10-CM

## 2021-03-12 DIAGNOSIS — R7309 Other abnormal glucose: Secondary | ICD-10-CM

## 2021-03-12 MED ORDER — AMLODIPINE BESYLATE 10 MG PO TABS: 10.0000 mg | ORAL_TABLET | Freq: Every day | ORAL | 3 refills | Status: DC

## 2021-03-12 NOTE — Patient Instructions (Addendum)
Thank you for coming to the office today.  Double Amlodipine 67m  x 2 = 168mdaily.  [place] refill the Amlodipine.  START anti inflammatory topical - OTC Voltaren (generic Diclofenac) topical 2-4 times a day as needed for pain swelling of affected joint for 1-2 weeks or longer.  Use RICE therapy: - R - Rest / relative rest with activity modification avoid overuse of joint - I - Ice packs (make sure you use a towel or sock / something to protect skin) - C - Compression with Ankle Brace / Wrap or Compression Socks or Sleeve / ACE wrap to apply pressure and reduce swelling allowing more support - E - Elevation - if significant swelling, lift leg above heart level (toes above your nose) to help reduce swelling, most helpful at night after day of being on your feet  Recommend to start taking Tylenol Extra Strength 50058mabs - take 1 to 2 tabs per dose (max 1000m85mvery 6-8 hours for pain (take regularly, don't skip a dose for next 7 days), max 24 hour daily dose is 6 tablets or 3000mg30m the future you can repeat the same everyday Tylenol course for 1-2 weeks at a time.   DUE for FASTING BLOOD WORK (no food or drink after midnight before the lab appointment, only water or coffee without cream/sugar on the morning of)  SCHEDULE "Lab Only" visit in the morning at the clinic for lab draw in [place] Lab Only appointment is at about 1 week before your next appointment, so that results will be available  For Lab Results, once available within 2-3 days of blood draw, you can can log in to MyChart online to view your results and a brief explanation. Also, we can discuss results at next follow-up visit.   Please schedule a Follow-up Appointment to: Return in about 6 weeks (around 04/23/2021) for 6 week fasting lab only then 1 week later Follow-up results HTN, Hemochromatosis, Sugar.  If you have any other questions or concerns, please feel free to call the office or send a message  through [place] may also schedule an earlier appointment if necessary.  Additionally, you may be receiving a survey about your experience at our office within a few days to 1 week by e-mail or mail. We value your feedback.  AlexaNobie [place]nam[place]

## 2021-03-12 NOTE — Progress Notes (Signed)
Subjective:    Patient ID: Ricky Mejia, male    DOB: [birth year], 50 y.o.   MRN: [id number]  Ricky Mejia is a 50 y.o. male presenting on 03/12/2021 for Establish Care   HPI  CHRONIC HTN: Reports history of high blood pressure for several years. Current Meds - Amlodipine 60m daily   Reports good compliance, took meds today. Tolerating well, w/o complaints. 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 He has done Physical Therapy, some relief but did not resolve.  Hereditary Hemochromatosis Iron Overload [facility], lost to follow-up, he had erythrocytosis and elevated Hgb, but negative for polycythemia. - Low iron diet.  Fatty Liver Elevated LFTs Elevated Blood sugar in past.   [facility] 03/12/2021 11/22/2018 11/03/2017  Decreased Interest 0 0 0  Down, Depressed, Hopeless 0 0 0  PHQ - 2 Score 0 0 0  Altered sleeping 0 1 0  Tired, decreased energy 0 1 1  Change in appetite 0 0 0  Feeling bad or failure about yourself  0 0 0  Trouble concentrating 0 1 0  Moving slowly or fidgety/restless 0 0 0  Suicidal thoughts 0 0 0  PHQ-9 Score 0 3 1  Difficult doing work/chores Not difficult at all - -    Past Medical History:  Diagnosis Date   Abnormal LFTs    Allergic rhinitis    Diverticulitis    HUS (hemolytic uremic syndrome)    Hyperplastic colon polyp    Hypertension    Obesity    Serrated adenoma of colon    T.T.P. syndrome ([facility])    Past Surgical History:  Procedure Laterality Date   COLONOSCOPY     COLONOSCOPY WITH PROPOFOL N/A 01/07/2018   Procedure: COLONOSCOPY WITH PROPOFOL;  Surgeon: VLin Landsman MD;  Location: [facility] ENDOSCOPY;  Service: Gastroenterology;  Laterality: N/A;    SIGMOIDOSCOPY     WISDOM TOOTH EXTRACTION     Social History   Socioeconomic History   Marital status: Divorced    Spouse name: Not on file   Number of children: Not on file   Years of education: Not on file   Highest education level: Not on file  Occupational History   Not on file  Tobacco Use   Smoking status: Never   Smokeless tobacco: Current    Types: Chew  Vaping Use   Vaping Use: Never used  Substance and Sexual Activity   Alcohol use: No   Drug use: No   Sexual activity: Not on file  Other Topics Concern   Not on file  Social History Narrative   Not on file   Social Determinants of Health   Financial Resource Strain: Not on file  Food Insecurity: Not on file  Transportation Needs: Not on file  Physical Activity: Not on file  Stress: Not on file  Social Connections: Not on file  Intimate Partner Violence: Not on file   Family History  Problem Relation Age of Onset   Lupus Mother    Lung cancer Father    Diabetes Mellitus I Father    Cancer Father    Osteoarthritis Maternal Grandmother    Cancer Maternal Grandmother    Osteoarthritis Maternal Grandfather    Colon cancer Maternal  Grandfather    Osteoarthritis Paternal Grandmother    Osteoarthritis Paternal Grandfather    Current Outpatient Medications on File Prior to Visit  Medication Sig   amLODipine (NORVASC) 10 MG tablet Take 1 tablet (10 mg total) by mouth daily.   cetirizine (ZYRTEC) 10 MG tablet Take 10 mg by mouth daily.   Multiple Vitamin (MULTIVITAMIN) tablet Take 1 tablet by mouth daily.   No current facility-administered medications on file prior to visit.    Review of Systems Per HPI unless specifically indicated above      Objective:    BP (!) 124/92 (BP Location: Left Arm, Cuff Size: Normal)   Pulse (!) 109   Ht 6' 1"  (1.854 m)   Wt 288 lb 9.6 oz (130.9 kg)   SpO2 98%   BMI 38.08 kg/m   Wt Readings from Last 3 Encounters:  03/12/21 288 lb 9.6 oz (130.9 kg)  09/13/20 260  lb (117.9 kg)  11/22/18 276 lb 3.2 oz (125.3 kg)    Physical Exam Vitals and nursing note reviewed.  Constitutional:      General: He is not in acute distress.    Appearance: Normal appearance. He is well-developed. He is not diaphoretic.     Comments: Well-appearing, comfortable, cooperative  HENT:     Head: Normocephalic and atraumatic.  Eyes:     General:        Right eye: No discharge.        Left eye: No discharge.     Conjunctiva/sclera: Conjunctivae normal.  Cardiovascular:     Rate and Rhythm: Normal rate.  Pulmonary:     Effort: Pulmonary effort is normal.  Skin:    General: Skin is warm and dry.     Findings: No erythema or rash.  Neurological:     Mental Status: He is alert and oriented to person, place, and time.  Psychiatric:        Mood and Affect: Mood normal.        Behavior: Behavior normal.        Thought Content: Thought content normal.     Comments: Well groomed, good eye contact, normal speech and thoughts   Results for orders placed or performed during the hospital encounter of 17/49/44  Basic metabolic panel  Result Value Ref Range   Sodium 140 135 - 145 mmol/L   Potassium 3.7 3.5 - 5.1 mmol/L   Chloride 106 98 - 111 mmol/L   CO2 23 22 - 32 mmol/L   Glucose, Bld 168 (H) 70 - 99 mg/dL   BUN 16 6 - 20 mg/dL   Creatinine, Ser 1.09 0.61 - 1.24 mg/dL   Calcium 9.8 8.9 - 10.3 mg/dL   GFR, Estimated >60 >60 mL/min   Anion gap 11 5 - 15  CBC  Result Value Ref Range   WBC 6.7 4.0 - 10.5 K/uL   RBC 5.08 4.22 - 5.81 MIL/uL   Hemoglobin 16.7 13.0 - 17.0 g/dL   HCT 45.9 39.0 - 52.0 %   MCV 90.4 80.0 - 100.0 fL   MCH 32.9 26.0 - 34.0 pg   MCHC 36.4 (H) 30.0 - 36.0 g/dL   RDW 12.4 11.5 - 15.5 %   Platelets 219 150 - 400 K/uL   nRBC 0.0 0.0 - 0.2 %  Hepatic function panel  Result Value Ref Range   Total Protein 7.4 6.5 - 8.1 g/dL   Albumin 4.4 3.5 - 5.0 g/dL   AST 75 (H) 15 - 41 U/L  ALT 99 (H) 0 - 44 U/L   Alkaline Phosphatase 75 38 - 126 U/L    Total Bilirubin 0.7 0.3 - 1.2 mg/dL   Bilirubin, Direct <0.1 0.0 - 0.2 mg/dL   Indirect Bilirubin NOT CALCULATED 0.3 - 0.9 mg/dL  D-dimer, quantitative  Result Value Ref Range   D-Dimer, Quant 0.55 (H) 0.00 - 0.50 ug/mL-FEU  Brain natriuretic peptide  Result Value Ref Range   B Natriuretic Peptide 11.3 0.0 - 100.0 pg/mL  CK  Result Value Ref Range   Total CK 485 (H) 49 - 397 U/L  Urinalysis, Complete w Microscopic Urine, Clean Catch  Result Value Ref Range   Color, Urine YELLOW (A) YELLOW   APPearance CLEAR (A) CLEAR   Specific Gravity, Urine 1.019 1.005 - 1.030   pH 6.0 5.0 - 8.0   Glucose, UA NEGATIVE NEGATIVE mg/dL   Hgb urine dipstick NEGATIVE NEGATIVE   Bilirubin Urine NEGATIVE NEGATIVE   Ketones, ur NEGATIVE NEGATIVE mg/dL   Protein, ur NEGATIVE NEGATIVE mg/dL   Nitrite NEGATIVE NEGATIVE   Leukocytes,Ua NEGATIVE NEGATIVE   RBC / HPF 0-5 0 - 5 RBC/hpf   WBC, UA 0-5 0 - 5 WBC/hpf   Bacteria, UA NONE SEEN NONE SEEN   Squamous Epithelial / LPF NONE SEEN 0 - 5  Troponin I (High Sensitivity)  Result Value Ref Range   Troponin I (High Sensitivity) 15 <18 ng/L  Troponin I (High Sensitivity)  Result Value Ref Range   Troponin I (High Sensitivity) 14 <18 ng/L      Assessment & Plan:   Problem List Items Addressed This Visit     Hereditary hemochromatosis ([place])   Essential hypertension - Primary   Relevant Medications   amLODipine (NORVASC) 10 MG tablet   Other Visit Diagnoses     Smokeless tobacco use       Encounter to establish care with new doctor           Establish care, review outside records.  Hereditary hemochromatosis Reviewed prior records. Will repeat blood panel next visit CBC + Ferritin panel to monitor, if abnormal will pursue further consult with Hematology for further management as indicated. Also in setting will check CMET with LFTs given fatty liver history and also abnormal glucose risk of preDM vs Diabetes.  HTN Elevated BP despite  amlodipine 30m Reviewed past history Will dose adjust today inc to 156mdaily new rx sent.   No orders of the defined types were placed in this encounter.     Follow up plan: Return in about 6 weeks (around 04/23/2021) for 6 week fasting lab only then 1 week later Follow-up results HTN, Hemochromatosis, Sugar.  Future labs 04/17/21 A1c, CMET, CBC, Ferritin panel  AlNobie PutnamDO [facility] 03/12/2021, 3:25 PM

## 2021-04-16 ENCOUNTER — Other Ambulatory Visit: Payer: Self-pay

## 2021-04-16 DIAGNOSIS — I10 Essential (primary) hypertension: Secondary | ICD-10-CM

## 2021-04-16 DIAGNOSIS — R7309 Other abnormal glucose: Secondary | ICD-10-CM

## 2021-04-16 DIAGNOSIS — R7989 Other specified abnormal findings of blood chemistry: Secondary | ICD-10-CM

## 2021-04-17 ENCOUNTER — Other Ambulatory Visit: Payer: Self-pay

## 2021-04-17 ENCOUNTER — Other Ambulatory Visit: Payer: Managed Care, Other (non HMO)

## 2021-04-18 LAB — COMPLETE METABOLIC PANEL WITH GFR
AG Ratio: 1.7 (calc) (ref 1.0–2.5)
ALT: 64 U/L — ABNORMAL HIGH (ref 9–46)
AST: 45 U/L — ABNORMAL HIGH (ref 10–40)
Albumin: 4.4 g/dL (ref 3.6–5.1)
Alkaline phosphatase (APISO): 68 U/L (ref 36–130)
BUN: 14 mg/dL (ref 7–25)
CO2: 27 mmol/L (ref 20–32)
Calcium: 9.6 mg/dL (ref 8.6–10.3)
Chloride: 104 mmol/L (ref 98–110)
Creat: 0.89 mg/dL (ref 0.60–1.29)
Globulin: 2.6 g/dL (calc) (ref 1.9–3.7)
Glucose, Bld: 87 mg/dL (ref 65–99)
Potassium: 4.2 mmol/L (ref 3.5–5.3)
Sodium: 139 mmol/L (ref 135–146)
Total Bilirubin: 0.7 mg/dL (ref 0.2–1.2)
Total Protein: 7 g/dL (ref 6.1–8.1)
eGFR: 106 mL/min/{1.73_m2} (ref >=60)

## 2021-04-18 LAB — CBC WITH DIFFERENTIAL/PLATELET
Absolute Monocytes: 696 cells/uL (ref 200–950)
Basophils Absolute: 52 cells/uL (ref 0–200)
Basophils Relative: 0.8 %
Eosinophils Absolute: 130 cells/uL (ref 15–500)
Eosinophils Relative: 2 %
HCT: 44.3 % (ref 38.5–50.0)
Hemoglobin: 15.5 g/dL (ref 13.2–17.1)
Lymphs Abs: 1411 cells/uL (ref 850–3900)
MCH: 33 pg (ref 27.0–33.0)
MCHC: 35 g/dL (ref 32.0–36.0)
MCV: 94.5 fL (ref 80.0–100.0)
MPV: 10.8 fL (ref 7.5–12.5)
Monocytes Relative: 10.7 %
Neutro Abs: 4212 cells/uL (ref 1500–7800)
Neutrophils Relative %: 64.8 %
Platelets: 208 10*3/uL (ref 140–400)
RBC: 4.69 10*6/uL (ref 4.20–5.80)
RDW: 13.3 % (ref 11.0–15.0)
Total Lymphocyte: 21.7 %
WBC: 6.5 10*3/uL (ref 3.8–10.8)

## 2021-04-18 LAB — IRON,TIBC AND FERRITIN PANEL
%SAT: 33 % (calc) (ref 20–48)
Ferritin: 227 ng/mL (ref 38–380)
Iron: 96 ug/dL (ref 50–180)
TIBC: 292 mcg/dL (calc) (ref 250–425)

## 2021-04-18 LAB — HEMOGLOBIN A1C
Hgb A1c MFr Bld: 5.8 % of total Hgb — ABNORMAL HIGH (ref <5.7)
Mean Plasma Glucose: 120 mg/dL
eAG (mmol/L): 6.6 mmol/L

## 2021-04-23 ENCOUNTER — Ambulatory Visit (INDEPENDENT_AMBULATORY_CARE_PROVIDER_SITE_OTHER): Payer: Managed Care, Other (non HMO) | Admitting: Family Medicine

## 2021-04-23 ENCOUNTER — Other Ambulatory Visit: Payer: Self-pay

## 2021-04-23 VITALS — BP 120/80 | HR 91 | Ht 73.0 in | Wt 294.4 lb

## 2021-04-23 DIAGNOSIS — K648 Other hemorrhoids: Secondary | ICD-10-CM

## 2021-04-23 DIAGNOSIS — R7303 Prediabetes: Secondary | ICD-10-CM

## 2021-04-23 DIAGNOSIS — I10 Essential (primary) hypertension: Secondary | ICD-10-CM

## 2021-04-23 NOTE — Patient Instructions (Addendum)
Thank you for coming to the office today.  BP is improved. Keep on Amlodipine current dose. 66m daily.  Recent Labs    04/17/21 0808  HGBA1C 5.8*   Improve diet during day with more protein more substantial meal during lunch. To avoid light headed.  -----------  - Try to stay well hydrated, avoid constipation and straining, eat a high fiber diet  Recommend trying OTC Miralax 17g = 1 capful in large glass water once daily for now, try several days to see if working, goal is soft stool or BM 1-2 times daily, if too loose then reduce dose or try every other day. If not effective may need to increase it to 2 doses at once in AM or may do 1 in morning and 1 in afternoon/evening  - This medicine is very safe and can be used often without any problem and will not make you dehydrated. It is good for use on AS NEEDED BASIS or even MAINTENANCE therapy for longer term for several days to weeks at a time to help regulate bowel movements  Other more natural remedies or preventative treatment: - Increase hydration with water - Increase fiber in diet (high fiber foods = vegetables, leafy greens, oats/grains) - May take OTC Fiber supplement (metamucil powder or pill/gummy) - May try OTC Probiotic  Recommend future consultation - with GI specialist Dr VMarius Ditchto discuss internal hemorrhoid banding procedure to treat them due to bleeding.    Please schedule a Follow-up Appointment to: Return in about 6 months (around 10/21/2021) for 6 month follow-up PreDM A1c, HTN, hemorrhoid int / bleeding.  If you have any other questions or concerns, please feel free to call the office or send a message through [place] You may also schedule an earlier appointment if necessary.  Additionally, you may be receiving a survey about your experience at our office within a few days to 1 week by e-mail or mail. We value your feedback.  ANobie Putnam DO [place]

## 2021-04-23 NOTE — Progress Notes (Signed)
Subjective:    Patient ID: Ricky Mejia, male    DOB: Oct 24, 1970, 51 y.o.   MRN: [id number]  Ricky Mejia is a 51 y.o. male presenting on 04/23/2021 for Hypertension and elevated a1c   HPI  CHRONIC HTN: Reports history of high blood pressure for several years. Improved, not checking at home Current Meds - Amlodipine 3m daily   Reports good compliance, took meds today. Tolerating well, w/o complaints. Denies CP, dyspnea, HA, edema, dizziness / lightheadedness  Episodic Lightheadedness Only eating crackers for break and lunch, 7am to 3pm without meal. Usually in afternoon will get lightheadedness  Elevated A1c Result A1c 5.8    Hereditary Hemochromatosis Iron Overload [facility], lost to follow-up, he had erythrocytosis and elevated Hgb, but negative for polycythemia. - Low iron diet. Last lab normal range Iron Panel Ferritin CBC  Internal Hemorrhoids, rectal bleeding Chronic history now of some bright red blood per rectum small amount but can be noticeable daily on toilet paper, some bleeding into toilet. He has had prior colonoscopy done in past 4 years that showed large internal hemorrhoids. He has painless bleeding. No protruding hemorrhoids. Not endorsing much constipation.   Fatty Liver Elevated LFTs Elevated Blood sugar in past. Lab shows improved LFTs  Health Maintenance:   [facility] 03/12/2021 11/22/2018 11/03/2017  Decreased Interest 0 0 0  Down, Depressed, Hopeless 0 0 0  PHQ - 2 Score 0 0 0  Altered sleeping 0 1 0  Tired, decreased energy 0 1 1  Change in appetite 0 0 0  Feeling bad or failure about yourself  0 0 0  Trouble concentrating 0 1 0  Moving slowly or fidgety/restless 0 0 0  Suicidal thoughts 0 0 0  PHQ-9 Score 0 3 1  Difficult doing work/chores Not difficult at all - -    Social History   Tobacco Use   Smoking status: Never   Smokeless tobacco: Current    Types: Chew  Vaping Use    Vaping Use: Never used  Substance Use Topics   Alcohol use: No   Drug use: No    Review of Systems Per HPI unless specifically indicated above     Objective:    BP 120/80 (BP Location: Left Arm, Cuff Size: Normal)    Pulse 91    Ht 6' 1"  (1.854 m)    Wt 294 lb 6.4 oz (133.5 kg)    SpO2 99%    BMI 38.84 kg/m   Wt Readings from Last 3 Encounters:  04/23/21 294 lb 6.4 oz (133.5 kg)  03/12/21 288 lb 9.6 oz (130.9 kg)  09/13/20 260 lb (117.9 kg)    Physical Exam Vitals and nursing note reviewed.  Constitutional:      General: He is not in acute distress.    Appearance: He is well-developed. He is obese. He is not diaphoretic.     Comments: Well-appearing, comfortable, cooperative  HENT:     Head: Normocephalic and atraumatic.  Eyes:     General:        Right eye: No discharge.        Left eye: No discharge.     Conjunctiva/sclera: Conjunctivae normal.  Neck:     Thyroid: No thyromegaly.  Cardiovascular:     Rate and Rhythm: Normal rate and regular rhythm.     Pulses: Normal pulses.     Heart sounds: Normal heart sounds. No murmur heard. Pulmonary:  Effort: Pulmonary effort is normal. No respiratory distress.     Breath sounds: Normal breath sounds. No wheezing or rales.  Musculoskeletal:        General: Normal range of motion.     Cervical back: Normal range of motion and neck supple.  Lymphadenopathy:     Cervical: No cervical adenopathy.  Skin:    General: Skin is warm and dry.     Findings: No erythema or rash.  Neurological:     Mental Status: He is alert and oriented to person, place, and time. Mental status is at baseline.  Psychiatric:        Behavior: Behavior normal.     Comments: Well groomed, good eye contact, normal speech and thoughts      Results for orders placed or performed in visit on 04/16/21  Iron, TIBC and Ferritin Panel  Result Value Ref Range   Iron 96 50 - 180 mcg/dL   TIBC 292 250 - 425 mcg/dL (calc)   %SAT 33 20 - 48 % (calc)    Ferritin 227 38 - 380 ng/mL  Hemoglobin A1c  Result Value Ref Range   Hgb A1c MFr Bld 5.8 (H) <5.7 % of total Hgb   Mean Plasma Glucose 120 mg/dL   eAG (mmol/L) 6.6 mmol/L  CBC with Differential/Platelet  Result Value Ref Range   WBC 6.5 3.8 - 10.8 Thousand/uL   RBC 4.69 4.20 - 5.80 Million/uL   Hemoglobin 15.5 13.2 - 17.1 g/dL   HCT 44.3 38.5 - 50.0 %   MCV 94.5 80.0 - 100.0 fL   MCH 33.0 27.0 - 33.0 pg   MCHC 35.0 32.0 - 36.0 g/dL   RDW 13.3 11.0 - 15.0 %   Platelets 208 140 - 400 Thousand/uL   MPV 10.8 7.5 - 12.5 fL   Neutro Abs 4,212 1,500 - 7,800 cells/uL   Lymphs Abs 1,411 850 - 3,900 cells/uL   Absolute Monocytes 696 200 - 950 cells/uL   Eosinophils Absolute 130 15 - 500 cells/uL   Basophils Absolute 52 0 - 200 cells/uL   Neutrophils Relative % 64.8 %   Total Lymphocyte 21.7 %   Monocytes Relative 10.7 %   Eosinophils Relative 2.0 %   Basophils Relative 0.8 %  COMPLETE METABOLIC PANEL WITH GFR  Result Value Ref Range   Glucose, Bld 87 65 - 99 mg/dL   BUN 14 7 - 25 mg/dL   Creat 0.89 0.60 - 1.29 mg/dL   eGFR 106 > OR = 60 mL/min/1.56m   BUN/Creatinine Ratio NOT APPLICABLE 6 - 22 (calc)   Sodium 139 135 - 146 mmol/L   Potassium 4.2 3.5 - 5.3 mmol/L   Chloride 104 98 - 110 mmol/L   CO2 27 20 - 32 mmol/L   Calcium 9.6 8.6 - 10.3 mg/dL   Total Protein 7.0 6.1 - 8.1 g/dL   Albumin 4.4 3.6 - 5.1 g/dL   Globulin 2.6 1.9 - 3.7 g/dL (calc)   AG Ratio 1.7 1.0 - 2.5 (calc)   Total Bilirubin 0.7 0.2 - 1.2 mg/dL   Alkaline phosphatase (APISO) 68 36 - 130 U/L   AST 45 (H) 10 - 40 U/L   ALT 64 (H) 9 - 46 U/L      Assessment & Plan:   Problem List Items Addressed This Visit     Pre-diabetes   Internal hemorrhoid, bleeding   He[place]itary hemochromatosis ([place]   Essential hypertension - Primary    PreDM A1c 5.8, stable Counseling on  lifestyle modification  Int Hemorrhoid bleeding Identified on prior colonoscopy, large Counseling on diet lifestyle avoid  constipation Defer repeat GI consult for now, can pursue Hemorrhoid banding next if persistent bleeding  HTN  Mild elevated, repeat manual more accurate improved COntinue AMlodipine 69m daily Lifestyle management  Hereditary hemochromatosis Normal Ferritin currently Monitor labs.  No orders of the defined types were placed in this encounter.  Follow up plan: Return in about 6 months (around 10/21/2021) for 6 month follow-up PreDM A1c, HTN, hemorrhoid int / bleeding.  ANobie Putnam [facility] 04/23/2021, 4:22 PM

## 2021-04-24 DIAGNOSIS — R7303 Prediabetes: Secondary | ICD-10-CM | POA: Insufficient documentation

## 2021-04-24 DIAGNOSIS — K648 Other hemorrhoids: Secondary | ICD-10-CM | POA: Insufficient documentation

## 2021-08-14 ENCOUNTER — Ambulatory Visit (INDEPENDENT_AMBULATORY_CARE_PROVIDER_SITE_OTHER): Payer: Managed Care, Other (non HMO) | Admitting: Family Medicine

## 2021-08-14 ENCOUNTER — Encounter: Payer: Self-pay | Admitting: Family Medicine

## 2021-08-14 VITALS — BP 132/88 | HR 99 | Ht 73.0 in | Wt 302.0 lb

## 2021-08-14 DIAGNOSIS — G5713 Meralgia paresthetica, bilateral lower limbs: Secondary | ICD-10-CM | POA: Diagnosis not present

## 2021-08-14 MED ORDER — GABAPENTIN 100 MG PO CAPS: ORAL_CAPSULE | ORAL | 2 refills | Status: DC

## 2021-08-14 NOTE — Patient Instructions (Addendum)
Thank you for coming to the office today. ? ?Likely meralgia paresthetica ? ?Start Gabapentin 137m capsules, take before bedtime for 2-3 days only, and then increase to 2 pills daily and then eventually max is 3 pills ? ?Recommend to start taking Tylenol Extra Strength 5068mtabs - take 1 to 2 tabs per dose (max 100013mevery 6-8 hours for pain (take regularly, don't skip a dose for next 7 days), max 24 hour daily dose is 6 tablets or 3000m27mn the future you can repeat the same everyday Tylenol course for 1-2 weeks at a time.  ? ?Recommend trial of Anti-inflammatory with OTC ALeve 250mg31ms - take one with food and plenty of water TWICE daily every day (breakfast and dinner), for next 2 to 4 weeks, then you may take only as needed ?- DO NOT TAKE any ibuprofen, motrin while you are taking this medicine ? ?Careful with lifting, tight belt and laying on side. ? ?If still not improved, can consider steroid course oral prednisone and referral to Neurologist. ? ? ?Please schedule a Follow-up Appointment to: Return if symptoms worsen or fail to improve. ? ?If you have any other questions or concerns, please feel free to call the office or send a message through [facility] may also schedule an earlier appointment if necessary. ? ?Additionally, you may be receiving a survey about your experience at our office within a few days to 1 week by e-mail or mail. We value your feedback. ? ?AlexaNobie Putnam?[place]?

## 2021-08-14 NOTE — Progress Notes (Signed)
? ?Subjective:  ? ? Patient ID: Ricky Mejia, male    DOB: 1970-09-11, 51 y.o.   MRN: [id number] ? ?IMRI LOR is a 51 y.o. male presenting on 08/14/2021 for Numbness and Leg Swelling ? ? ?HPI ? ?Bilateral Thigh Paresthesia ?Meralgia Paresthetica ?Onset 3-4 with R sided thigh upper leg lateral numbness, tingling, burning pain. Seems to be persistent numbness and episodic paresthesia burning pain. ?He admits sleeps on side at night makes the pain worse then will change to other side. ? ? ?  03/12/2021  ?  2:56 PM 11/22/2018  ?  9:31 AM 11/03/2017  ?  8:05 AM  ?Depression screen PHQ 2/9  ?Decreased Interest 0 0 0  ?Down, Depressed, Hopeless 0 0 0  ?PHQ - 2 Score 0 0 0  ?Altered sleeping 0 1 0  ?Tired, decreased energy 0 1 1  ?Change in appetite 0 0 0  ?Feeling bad or failure about yourself  0 0 0  ?Trouble concentrating 0 1 0  ?Moving slowly or fidgety/restless 0 0 0  ?Suicidal thoughts 0 0 0  ?PHQ-9 Score 0 3 1  ?Difficult doing work/chores Not difficult at all    ? ? ?Social History  ? ?Tobacco Use  ? Smoking status: Never  ? Smokeless tobacco: Current  ?  Types: Chew  ?Vaping Use  ? Vaping Use: Never used  ?Substance Use Topics  ? Alcohol use: No  ? Drug use: No  ? ? ?Review of Systems ?Per HPI unless specifically indicated above ? ?   ?Objective:  ?  ?BP 132/88   Pulse 99   Ht _0  (1.854 m)   Wt (!) 302 lb (137 kg)   SpO2 99%   BMI 39.84 kg/m?   ?Wt Readings from Last 3 Encounters:  ?08/14/21 (!) 302 lb (137 kg)  ?04/23/21 294 lb 6.4 oz (133.5 kg)  ?03/12/21 288 lb 9.6 oz (130.9 kg)  ?  ?Physical Exam ?Vitals and nursing note reviewed.  ?Constitutional:   ?   General: He is not in acute distress. ?   Appearance: Normal appearance. He is well-developed. He is not diaphoretic.  ?   Comments: Well-appearing, comfortable, cooperative  ?HENT:  ?   Head: Normocephalic and atraumatic.  ?Eyes:  ?   General:     ?   Right eye: No discharge.     ?   Left eye: No discharge.  ?   Conjunctiva/sclera: Conjunctivae  normal.  ?Cardiovascular:  ?   Rate and Rhythm: Normal rate.  ?Pulmonary:  ?   Effort: Pulmonary effort is normal.  ?Musculoskeletal:  ?   Right lower leg: No edema.  ?   Left lower leg: No edema.  ?   Comments: No deformity of lower extremity or hips or back. No significant muscle spasm or hypertonicity.  ?Skin: ?   General: Skin is warm and dry.  ?   Findings: No erythema or rash.  ?Neurological:  ?   Mental Status: He is alert and oriented to person, place, and time.  ?Psychiatric:     ?   Mood and Affect: Mood normal.     ?   Behavior: Behavior normal.     ?   Thought Content: Thought content normal.  ?   Comments: Well groomed, good eye contact, normal speech and thoughts  ? ?Results for orders placed or performed in visit on 04/16/21  ?Iron, TIBC and Ferritin Panel  ?Result Value Ref Range  ? Iron 96  50 - 180 mcg/dL  ? TIBC 292 250 - 425 mcg/dL (calc)  ? %SAT 33 20 - 48 % (calc)  ? Ferritin 227 38 - 380 ng/mL  ?Hemoglobin A1c  ?Result Value Ref Range  ? Hgb A1c MFr Bld 5.8 (H) <5.7 % of total Hgb  ? Mean Plasma Glucose 120 mg/dL  ? eAG (mmol/L) 6.6 mmol/L  ?CBC with Differential/Platelet  ?Result Value Ref Range  ? WBC 6.5 3.8 - 10.8 Thousand/uL  ? RBC 4.69 4.20 - 5.80 Million/uL  ? Hemoglobin 15.5 13.2 - 17.1 g/dL  ? HCT 44.3 38.5 - 50.0 %  ? MCV 94.5 80.0 - 100.0 fL  ? MCH 33.0 27.0 - 33.0 pg  ? MCHC 35.0 32.0 - 36.0 g/dL  ? RDW 13.3 11.0 - 15.0 %  ? Platelets 208 140 - 400 Thousand/uL  ? MPV 10.8 7.5 - 12.5 fL  ? Neutro Abs 4,212 1,500 - 7,800 cells/uL  ? Lymphs Abs 1,411 850 - 3,900 cells/uL  ? Absolute Monocytes 696 200 - 950 cells/uL  ? Eosinophils Absolute 130 15 - 500 cells/uL  ? Basophils Absolute 52 0 - 200 cells/uL  ? Neutrophils Relative % 64.8 %  ? Total Lymphocyte 21.7 %  ? Monocytes Relative 10.7 %  ? Eosinophils Relative 2.0 %  ? Basophils Relative 0.8 %  ?COMPLETE METABOLIC PANEL WITH GFR  ?Result Value Ref Range  ? Glucose, Bld 87 65 - 99 mg/dL  ? BUN 14 7 - 25 mg/dL  ? Creat 0.89 0.60 - 1.29  mg/dL  ? eGFR 106 > OR = 60 mL/min/1.82m  ? BUN/Creatinine Ratio NOT APPLICABLE 6 - 22 (calc)  ? Sodium 139 135 - 146 mmol/L  ? Potassium 4.2 3.5 - 5.3 mmol/L  ? Chloride 104 98 - 110 mmol/L  ? CO2 27 20 - 32 mmol/L  ? Calcium 9.6 8.6 - 10.3 mg/dL  ? Total Protein 7.0 6.1 - 8.1 g/dL  ? Albumin 4.4 3.6 - 5.1 g/dL  ? Globulin 2.6 1.9 - 3.7 g/dL (calc)  ? AG Ratio 1.7 1.0 - 2.5 (calc)  ? Total Bilirubin 0.7 0.2 - 1.2 mg/dL  ? Alkaline phosphatase (APISO) 68 36 - 130 U/L  ? AST 45 (H) 10 - 40 U/L  ? ALT 64 (H) 9 - 46 U/L  ? ?   ?Assessment & Plan:  ? ?Problem List Items Addressed This Visit   ?None ?Visit Diagnoses   ? ? Meralgia paresthetica of both lower extremities    -  Primary  ? Relevant Medications  ? gabapentin (NEURONTIN) 100 MG capsule  ? ?  ?  ?Likely meralgia paresthetica ? ?Start Gabapentin 1017mcapsules, take before bedtime for 2-3 days only, and then increase to 2 pills daily and then eventually max is 3 pills ? ?Recommend to start taking Tylenol Extra Strength 50076mabs - take 1 to 2 tabs per dose (max 1000m27mvery 6-8 hours for pain (take regularly, don't skip a dose for next 7 days), max 24 hour daily dose is 6 tablets or 3000mg4m the future you can repeat the same everyday Tylenol course for 1-2 weeks at a time.  ? ?Recommend trial of Anti-inflammatory with OTC ALeve 250mg 51m - take one with food and plenty of water TWICE daily every day (breakfast and dinner), for next 2 to 4 weeks, then you may take only as needed ?- DO NOT TAKE any ibuprofen, motrin while you are taking this medicine ? ?Careful with lifting, tight  belt and laying on side. ? ?If still not improved, can consider steroid course oral prednisone and referral to Neurologist. ? ?Meds ordered this encounter  ?Medications  ? gabapentin (NEURONTIN) 100 MG capsule  ?  Sig: Start 1 capsule daily before sleep, increase by 1 cap every 2-3 days as tolerated up to max dose 3 pills before sleep.  ?  Dispense:  90 capsule  ?  Refill:  2   ? ? ? ? ?Follow up plan: ?Return if symptoms worsen or fail to improve. ? ?[name], DO ?[facility] ?[facility] ?08/14/2021, 3:12 PM ?

## 2021-10-21 ENCOUNTER — Other Ambulatory Visit: Payer: Self-pay | Admitting: Family Medicine

## 2021-10-21 ENCOUNTER — Ambulatory Visit (INDEPENDENT_AMBULATORY_CARE_PROVIDER_SITE_OTHER): Payer: Managed Care, Other (non HMO) | Admitting: Family Medicine

## 2021-10-21 ENCOUNTER — Encounter: Payer: Self-pay | Admitting: Family Medicine

## 2021-10-21 VITALS — BP 124/89 | HR 97 | Ht 73.0 in | Wt 309.8 lb

## 2021-10-21 DIAGNOSIS — G5713 Meralgia paresthetica, bilateral lower limbs: Secondary | ICD-10-CM | POA: Diagnosis not present

## 2021-10-21 DIAGNOSIS — R7303 Prediabetes: Secondary | ICD-10-CM

## 2021-10-21 DIAGNOSIS — Z125 Encounter for screening for malignant neoplasm of prostate: Secondary | ICD-10-CM

## 2021-10-21 DIAGNOSIS — I10 Essential (primary) hypertension: Secondary | ICD-10-CM | POA: Diagnosis not present

## 2021-10-21 DIAGNOSIS — L853 Xerosis cutis: Secondary | ICD-10-CM | POA: Diagnosis not present

## 2021-10-21 DIAGNOSIS — Z0000 Encounter for general adult medical examination without abnormal findings: Secondary | ICD-10-CM

## 2021-10-21 DIAGNOSIS — E781 Pure hyperglyceridemia: Secondary | ICD-10-CM

## 2021-10-21 LAB — POCT GLYCOSYLATED HEMOGLOBIN (HGB A1C): Hemoglobin A1C: 5.8 % — AB (ref 4.0–5.6)

## 2021-10-21 MED ORDER — TRIAMCINOLONE 0.1 % CREAM:EUCERIN CREAM 1:1: 1.0000 | TOPICAL_CREAM | Freq: Two times a day (BID) | CUTANEOUS | 2 refills | Status: DC | PRN

## 2021-10-21 NOTE — Patient Instructions (Addendum)
Thank you for coming to the office today.  Recent Labs    04/17/21 0808 10/21/21 1611  HGBA1C 5.8* 5.8*   Stop gabapentin if not helping  Goal for weight loss for nerve pinching  Use topical jar medicated cream steroid + moisturizer twice a day for 2 weeks then repeat as needed for dry skin  Referral to [facility]   [place], [place] [address] Hours: 8AM-5PM Phone: [phone]   DUE for [place] (no food or drink after midnight before the lab appointment, only water or coffee without cream/sugar on the morning of)  SCHEDULE "Lab Only" visit in the morning at the clinic for lab draw in [place]   - Make sure Lab Only appointment is at about 1 week before your next appointment, so that results will be available  For Lab Results, once available within 2-3 days of blood draw, you can can log in to MyChart online to view your results and a brief explanation. Also, we can discuss results at next follow-up visit.     Please schedule a Follow-up Appointment to: Return in about 6 months (around 04/23/2022) for 6 month fasting lab only then 1 week later Annual Physical.  If you have any other questions or concerns, please feel free to call the office or send a message through [place]. You may also schedule an earlier appointment if necessary.  Additionally, you may be receiving a survey about your experience at our office within a few days to 1 week by e-mail or mail. We value your feedback.  [name], DO [place]

## 2021-10-21 NOTE — Progress Notes (Signed)
Subjective:    Patient ID: Ricky Mejia, male    DOB: 1970/12/24, 51 y.o.   MRN: [id number]  Ricky Mejia is a 51 y.o. male presenting on 10/21/2021 for Prediabetes and Hypertension   HPI  Dry Skin Hands feet knees, thickened callus, often will have dirty dry hands Works with gloves, boots Using vaseline, lotion Not using medicine   Meralgia paresthetica Still has similar problem with pinched nerve. Not really improving but not worse Goal to wt loss  CHRONIC HTN: Reports history of high blood pressure for several years. Improved, not checking at home Current Meds - Amlodipine 70m daily   Reports good compliance, took meds today. Tolerating well, w/o complaints. Denies CP, dyspnea, HA, edema, dizziness / lightheadedness   Episodic Lightheadedness Only eating crackers for break and lunch, 7am to 3pm without meal. Usually in afternoon will get lightheadedness Still occurs, he has not changed eating habits   Pre-Diabetes / Elevated A1c Result A1c 5.8, unchanged from 04/2021        03/12/2021    2:56 PM 11/22/2018    9:31 AM 11/03/2017    8:05 AM  Depression screen PHQ 2/9  Decreased Interest 0 0 0  Down, Depressed, Hopeless 0 0 0  PHQ - 2 Score 0 0 0  Altered sleeping 0 1 0  Tired, decreased energy 0 1 1  Change in appetite 0 0 0  Feeling bad or failure about yourself  0 0 0  Trouble concentrating 0 1 0  Moving slowly or fidgety/restless 0 0 0  Suicidal thoughts 0 0 0  PHQ-9 Score 0 3 1  Difficult doing work/chores Not difficult at all      Social History   Tobacco Use   Smoking status: Never   Smokeless tobacco: Current    Types: Chew  Vaping Use   Vaping Use: Never used  Substance Use Topics   Alcohol use: No   Drug use: No    Review of Systems Per HPI unless specifically indicated above     Objective:    BP 124/89   Pulse 97   Ht 6' 1"  (1.854 m)   Wt (!) 309 lb 12.8 oz (140.5 kg)   SpO2 96%   BMI 40.87 kg/m   Wt Readings from  Last 3 Encounters:  10/21/21 (!) 309 lb 12.8 oz (140.5 kg)  08/14/21 (!) 302 lb (137 kg)  04/23/21 294 lb 6.4 oz (133.5 kg)    Physical Exam Vitals and nursing note reviewed.  Constitutional:      General: He is not in acute distress.    Appearance: He is well-developed. He is not diaphoretic.     Comments: Well-appearing, comfortable, cooperative  HENT:     Head: Normocephalic and atraumatic.  Eyes:     General:        Right eye: No discharge.        Left eye: No discharge.     Conjunctiva/sclera: Conjunctivae normal.  Neck:     Thyroid: No thyromegaly.  Cardiovascular:     Rate and Rhythm: Normal rate and regular rhythm.     Pulses: Normal pulses.     Heart sounds: Normal heart sounds. No murmur heard. Pulmonary:     Effort: Pulmonary effort is normal. No respiratory distress.     Breath sounds: Normal breath sounds. No wheezing or rales.  Musculoskeletal:        General: Normal range of motion.     Cervical back: Normal range  of motion and neck supple.  Lymphadenopathy:     Cervical: No cervical adenopathy.  Skin:    General: Skin is warm and dry.     Findings: No erythema or rash.     Comments: Dry callus dirty appearing hands thickened skin  Neurological:     Mental Status: He is alert and oriented to person, place, and time. Mental status is at baseline.  Psychiatric:        Behavior: Behavior normal.     Comments: Well groomed, good eye contact, normal speech and thoughts     Recent Labs    04/17/21 0808 10/21/21 1611  HGBA1C 5.8* 5.8*     Results for orders placed or performed in visit on 10/21/21  POCT HgB A1C  Result Value Ref Range   Hemoglobin A1C 5.8 (A) 4.0 - 5.6 %      Assessment & Plan:   Problem List Items Addressed This Visit     Pre-diabetes - Primary   Relevant Orders   POCT HgB A1C (Completed)   Essential hypertension   Other Visit Diagnoses     Dry skin dermatitis       Relevant Medications   Triamcinolone Acetonide  (TRIAMCINOLONE 0.1 % CREAM : EUCERIN) CREA   Other Relevant Orders   Ambulatory referral to Dermatology   Meralgia paresthetica of both lower extremities           preDM A1c stable 5.8 Encourage continue lifestyle modification No medication needed  Meralgia paresthetica Stop gabapentin if not helping Goal for weight loss for nerve pinching  Dry Skin Dermatitis / Hands Use topical jar medicated cream steroid + moisturizer twice a day for 2 weeks then repeat as needed for dry skin  Referral to [facility]   [place], [place] [address] Hours: 8AM-5PM Phone: [phone]  Orders Placed This Encounter  Procedures   Ambulatory referral to Dermatology    Referral Priority:   Routine    Referral Type:   Consultation    Referral Reason:   Specialty Services Required    Requested Specialty:   Dermatology    Number of Visits Requested:   1   POCT HgB A1C     Meds ordered this encounter  Medications   Triamcinolone Acetonide (TRIAMCINOLONE 0.1 % CREAM : EUCERIN) CREA    Sig: Apply 1 Application topically 2 (two) times daily as needed for irritation (dry skin). For up to 2 weeks. May repeat as needed    Dispense:  1 each    Refill:  2    In jar, 1 lb mixture 1:1 ratio      Follow up plan: Return in about 6 months (around 04/23/2022) for 6 month fasting lab only then 1 week later Annual Physical.  Future labs ordered for 04/23/22   [name], [facility] 10/21/2021, 4:08 PM

## 2021-10-23 ENCOUNTER — Ambulatory Visit: Payer: Self-pay | Admitting: *Deleted

## 2021-10-23 NOTE — Telephone Encounter (Signed)
  Chief Complaint: Katharine Look from [facility] calling to clarify medication orders for Triamcinolone Acetonide (TRIAMCINOLONE 0.1 % CREAM : EUCERIN) CREA Symptoms: na Frequency: na Pertinent Negatives: Patient denies na Disposition: [] ED /[] Urgent Care (no appt availability in office) / [] Appointment(In office/virtual)/ []  [place]/ [] Home Care/ [] Refused Recommended Disposition /[] [facility]/ [x]  Follow-up with PCP Additional Notes:   Please clarify if 1:1 ratio is = to 454 grams . Please call back at  # [phone]   Reason for Disposition  [1] Pharmacy calling with prescription question AND [2] triager unable to answer question  Answer Assessment - Initial Assessment Questions 1. NAME of MEDICATION: "What medicine are you calling about?"     Triamcinolone Acetonide (TRIAMCINOLONE 0.1 % CREAM : EUCERIN) CREA 2. QUESTION: "What is your question?" (e.g., double dose of medicine, side effect)     Needs clarification of 1:1 ratio. Does that mean 454 grams? 3. PRESCRIBING HCP: "Who prescribed it?" Reason: if prescribed by specialist, call should be referred to that group.     pcp 4. SYMPTOMS: "Do you have any symptoms?"     na 5. SEVERITY: If symptoms are present, ask "Are they mild, moderate or severe?"     na 6. PREGNANCY:  "Is there any chance that you are pregnant?" "When was your last menstrual period?"     na  Protocols used: Medication Question Call-A-AH

## 2021-10-28 ENCOUNTER — Ambulatory Visit: Payer: Self-pay | Admitting: *Deleted

## 2021-10-28 DIAGNOSIS — L853 Xerosis cutis: Secondary | ICD-10-CM

## 2021-10-28 NOTE — Telephone Encounter (Signed)
Requested medication (s) are due for refill today: yes  Requested medication (s) are on the active medication list: yes  Last refill:  10/21/21 #1 each 2 refills  Future visit scheduled: yes in 6 months  Notes to clinic:  not delegated per protocol . Last ordered on 10/21/21 . Patient reports he has never received Rx and would now like Rx to be sent to [facility].     Requested Prescriptions  Pending Prescriptions Disp Refills   Triamcinolone Acetonide (TRIAMCINOLONE 0.1 % CREAM : EUCERIN) CREA 1 each 2    Sig: Apply 1 Application topically 2 (two) times daily as needed for irritation (dry skin). For up to 2 weeks. May repeat as needed     Off-Protocol Failed - 10/28/2021  4:12 PM      Failed - Medication not assigned to a protocol, review manually.      Passed - Valid encounter within last 12 months    Recent Outpatient Visits           1 week ago Pre-diabetes   [place], DO   2 months ago Meralgia paresthetica of both lower extremities   [place], DO   6 months ago Essential hypertension   [place], DO   7 months ago Essential hypertension   [place], DO   2 years ago Annual physical exam   [facility] Volney American, [place]       Future Appointments             In 6 months [place], Devonne Doughty, DO [facility], [place]   In 6 months Ralene Bathe, MD [place]           Not Delegated - Dermatology:  Corticosteroids Failed - 10/28/2021  4:12 PM      Failed - This refill cannot be delegated      Passed - Valid encounter within last 12 months    Recent Outpatient Visits           1 week ago Pre-diabetes   [place], DO   2 months ago Meralgia paresthetica of both lower extremities   [facility] Olin Hauser, DO   6 months ago Essential hypertension   [facility] Olin Hauser, DO   7 months ago Essential hypertension   [place], Devonne Doughty, DO   2 years ago Annual physical exam   [facility] Volney American, [place]       Future Appointments             In 6 months Parks Ranger, Devonne Doughty, [facility], [place]   In 6 months Ralene Bathe, MD [place]           Dermatology:  Other Passed - 10/28/2021  4:12 PM      Passed - Valid encounter within last 12 months    Recent Outpatient Visits           1 week ago Pre-diabetes   [place], DO   2 months ago Meralgia paresthetica of both lower extremities   [facility] Olin Hauser, DO   6 months ago Essential hypertension   [facility] [name]  J, DO   7 months ago Essential hypertension   [place], DO   2 years ago Annual physical exam   [facility] Volney American, [place]       Future Appointments             In 6 months Parks Ranger, Devonne Doughty, DO [facility], [place]   In 6 months Ralene Bathe, MD [place]

## 2021-10-28 NOTE — Telephone Encounter (Signed)
Summary: Medication   Patient's mother states she has requested a cream for patient's feet through pharmacy 2 wks ago and was informed they are waiting on provider,  mother does not know name of the medication and states it is not a medication patient currently has   I was unable to locate a refill request, please assist patient further        Chief Complaint: requesting medication prescribed from 10/21/21. Medication was never received by patient .  Symptoms: na Frequency: na Pertinent Negatives: Patient denies na Disposition: [] ED /[] Urgent Care (no appt availability in office) / [] Appointment(In office/virtual)/ []  [facility]/ [] Home Care/ [] Refused Recommended Disposition /[] [place] [facility]/ [x]  Follow-up with PCP Additional Notes:   Patient requesting to send Rx to a different pharmacy [facility]. Sending  request for Rx trimcinlolne Acetonid 0.1% cream to be filled.    Reason for Disposition  Prescription request for new medicine (not a refill)  Answer Assessment - Initial Assessment Questions 1. DRUG NAME: "What medicine do you need to have refilled?"     Trimcinolone Acetonid 0.1% cream  2. REFILLS REMAINING: "How many refills are remaining?" (Note: The label on the medicine or pill bottle will show how many refills are remaining. If there are no refills remaining, then a renewal may be needed.)     Patient has not had previous dose filled since ordered on 10/21/21 3. EXPIRATION DATE: "What is the expiration date?" (Note: The label states when the prescription will expire, and thus can no longer be refilled.)     na 4. PRESCRIBING HCP: "Who prescribed it?" Reason: If prescribed by specialist, call should be referred to that group.     PCP 5. SYMPTOMS: "Do you have any symptoms?"     Irritated skin 6. PREGNANCY: "Is there any chance that you are pregnant?" "When was your last menstrual period?"     na  Protocols used: Medication Refill and Renewal  [facility]

## 2021-10-29 MED ORDER — TRIAMCINOLONE 0.1 % CREAM:EUCERIN CREAM 1:1: 1.0000 | TOPICAL_CREAM | Freq: Two times a day (BID) | CUTANEOUS | 2 refills | Status: DC | PRN

## 2021-10-29 NOTE — Telephone Encounter (Signed)
This is a duplicate request I have called [facility] and resolved the issue.  Ricky Mejia, [facility] 10/29/2021, 5:23 PM

## 2021-10-29 NOTE — Telephone Encounter (Addendum)
I have called [facility]  There seems to be some miscommunication.  There was a request last week on 7/12 that did not reach me. The pharmacy needed clarification on amount.  Since no response they already filled the med for patient and he picked it up last night  Signing this encounter. No further action required.  [name], [facility] 10/29/2021, 5:25 PM

## 2022-03-29 ENCOUNTER — Other Ambulatory Visit: Payer: Self-pay | Admitting: Family Medicine

## 2022-03-29 DIAGNOSIS — I10 Essential (primary) hypertension: Secondary | ICD-10-CM

## 2022-03-31 NOTE — Telephone Encounter (Signed)
Requested Prescriptions  Pending Prescriptions Disp Refills   amLODipine (NORVASC) 10 MG tablet [Pharmacy Med Name: amLODIPine BESYLATE 10 MG TAB] 90 tablet 0    Sig: TAKE ONE TABLET BY MOUTH DAILY     Cardiovascular: Calcium Channel Blockers 2 Passed - 03/29/2022  6:53 AM      Passed - Last BP in normal range    BP Readings from Last 1 Encounters:  10/21/21 124/89         Passed - Last Heart Rate in normal range    Pulse Readings from Last 1 Encounters:  10/21/21 97         Passed - Valid encounter within last 6 months    Recent Outpatient Visits           5 months ago Pre-diabetes   [place], DO   7 months ago Meralgia paresthetica of both lower extremities   [facility] Olin Hauser, DO   11 months ago Essential hypertension   [place], Devonne Doughty, DO   1 year ago Essential hypertension   [place], Devonne Doughty, DO   3 years ago Annual physical exam   [facility] Volney American, [place]       Future Appointments             In 1 month Parks Ranger, Devonne Doughty, DO [facility], [place]   In 1 month Ralene Bathe, MD [place]

## 2022-04-22 ENCOUNTER — Other Ambulatory Visit: Payer: Self-pay

## 2022-04-22 DIAGNOSIS — I10 Essential (primary) hypertension: Secondary | ICD-10-CM

## 2022-04-22 DIAGNOSIS — E781 Pure hyperglyceridemia: Secondary | ICD-10-CM

## 2022-04-22 DIAGNOSIS — R7303 Prediabetes: Secondary | ICD-10-CM

## 2022-04-22 DIAGNOSIS — Z0000 Encounter for general adult medical examination without abnormal findings: Secondary | ICD-10-CM

## 2022-04-22 DIAGNOSIS — Z125 Encounter for screening for malignant neoplasm of prostate: Secondary | ICD-10-CM

## 2022-04-23 ENCOUNTER — Other Ambulatory Visit: Payer: Managed Care, Other (non HMO)

## 2022-04-24 LAB — LIPID PANEL
Cholesterol: 164 mg/dL (ref <200)
HDL: 47 mg/dL (ref >=40)
LDL Cholesterol (Calc): 97 mg/dL (calc)
Non-HDL Cholesterol (Calc): 117 mg/dL (calc) (ref <130)
Total CHOL/HDL Ratio: 3.5 (calc) (ref <5.0)
Triglycerides: 106 mg/dL (ref <150)

## 2022-04-24 LAB — CBC WITH DIFFERENTIAL/PLATELET
Absolute Monocytes: 567 cells/uL (ref 200–950)
Basophils Absolute: 50 cells/uL (ref 0–200)
Basophils Relative: 0.9 %
Eosinophils Absolute: 77 cells/uL (ref 15–500)
Eosinophils Relative: 1.4 %
HCT: 45.8 % (ref 38.5–50.0)
Hemoglobin: 16.2 g/dL (ref 13.2–17.1)
Lymphs Abs: 1254 cells/uL (ref 850–3900)
MCH: 33.4 pg — ABNORMAL HIGH (ref 27.0–33.0)
MCHC: 35.4 g/dL (ref 32.0–36.0)
MCV: 94.4 fL (ref 80.0–100.0)
MPV: 11.3 fL (ref 7.5–12.5)
Monocytes Relative: 10.3 %
Neutro Abs: 3553 cells/uL (ref 1500–7800)
Neutrophils Relative %: 64.6 %
Platelets: 234 10*3/uL (ref 140–400)
RBC: 4.85 10*6/uL (ref 4.20–5.80)
RDW: 12.7 % (ref 11.0–15.0)
Total Lymphocyte: 22.8 %
WBC: 5.5 10*3/uL (ref 3.8–10.8)

## 2022-04-24 LAB — COMPLETE METABOLIC PANEL WITH GFR
AG Ratio: 1.6 (calc) (ref 1.0–2.5)
ALT: 60 U/L — ABNORMAL HIGH (ref 9–46)
AST: 41 U/L — ABNORMAL HIGH (ref 10–40)
Albumin: 4.2 g/dL (ref 3.6–5.1)
Alkaline phosphatase (APISO): 81 U/L (ref 36–130)
BUN: 11 mg/dL (ref 7–25)
CO2: 27 mmol/L (ref 20–32)
Calcium: 9.2 mg/dL (ref 8.6–10.3)
Chloride: 105 mmol/L (ref 98–110)
Creat: 0.88 mg/dL (ref 0.60–1.29)
Globulin: 2.7 g/dL (calc) (ref 1.9–3.7)
Glucose, Bld: 100 mg/dL — ABNORMAL HIGH (ref 65–99)
Potassium: 4.5 mmol/L (ref 3.5–5.3)
Sodium: 140 mmol/L (ref 135–146)
Total Bilirubin: 0.6 mg/dL (ref 0.2–1.2)
Total Protein: 6.9 g/dL (ref 6.1–8.1)
eGFR: 105 mL/min/{1.73_m2} (ref >=60)

## 2022-04-24 LAB — PSA: PSA: 1.19 ng/mL (ref <=4.00)

## 2022-04-24 LAB — HEMOGLOBIN A1C
Hgb A1c MFr Bld: 5.9 % of total Hgb — ABNORMAL HIGH (ref <5.7)
Mean Plasma Glucose: 123 mg/dL
eAG (mmol/L): 6.8 mmol/L

## 2022-04-24 LAB — TSH: TSH: 1.19 mIU/L (ref 0.40–4.50)

## 2022-04-30 ENCOUNTER — Ambulatory Visit (INDEPENDENT_AMBULATORY_CARE_PROVIDER_SITE_OTHER): Payer: Managed Care, Other (non HMO) | Admitting: Family Medicine

## 2022-04-30 ENCOUNTER — Encounter: Payer: Self-pay | Admitting: Family Medicine

## 2022-04-30 VITALS — BP 136/84 | HR 85 | Temp 97.1°F | Ht 73.0 in | Wt 308.0 lb

## 2022-04-30 DIAGNOSIS — Z0000 Encounter for general adult medical examination without abnormal findings: Secondary | ICD-10-CM | POA: Diagnosis not present

## 2022-04-30 DIAGNOSIS — E781 Pure hyperglyceridemia: Secondary | ICD-10-CM | POA: Diagnosis not present

## 2022-04-30 DIAGNOSIS — I10 Essential (primary) hypertension: Secondary | ICD-10-CM | POA: Diagnosis not present

## 2022-04-30 DIAGNOSIS — R7303 Prediabetes: Secondary | ICD-10-CM

## 2022-04-30 DIAGNOSIS — G588 Other specified mononeuropathies: Secondary | ICD-10-CM

## 2022-04-30 DIAGNOSIS — K648 Other hemorrhoids: Secondary | ICD-10-CM

## 2022-04-30 DIAGNOSIS — G5713 Meralgia paresthetica, bilateral lower limbs: Secondary | ICD-10-CM

## 2022-04-30 DIAGNOSIS — R202 Paresthesia of skin: Secondary | ICD-10-CM

## 2022-04-30 DIAGNOSIS — L853 Xerosis cutis: Secondary | ICD-10-CM

## 2022-04-30 MED ORDER — AMLODIPINE BESYLATE 10 MG PO TABS: 10.0000 mg | ORAL_TABLET | Freq: Every day | ORAL | 3 refills | Status: DC

## 2022-04-30 NOTE — Patient Instructions (Addendum)
Thank you for coming to the office today.  Keep up with Dermatologist next week 1/22   Referral sent to Neurology  [facility] - Neurology Dept [place], [place] [address] Phone: [phone]  For nerve impingement of lower legs and weakness / numbness in left leg.  ------------  Recent Labs    10/21/21 1611 04/23/22 0811  HGBA1C 5.8* 5.9*   Goal to keep improving limit carb starches sugars. With Pre Diabetes  Cholesterol has major improvement. Keep up the great work!  Liver improved but not normalized yet.  BP - will re fill Amlodipine '10mg'$  daily, you can use a pill cutter for half tab once a day if you monitor your BP. If >135/85 I still believe that you need the whole pill.  ---------  Referral to GI again for Internal Hemorrhoids with bleeding.  [facility]) [place],  [address] Phone: [phone]   DUE for FASTING BLOOD WORK (no food or drink after midnight before the lab appointment, only water or coffee without cream/sugar on the morning of)  SCHEDULE "Lab Only" visit in the morning at the clinic for lab draw in 6 MONTHS   - Make sure Lab Only appointment is at about 1 week before your next appointment, so that results will be available  For Lab Results, once available within 2-3 days of blood draw, you can can log in to MyChart online to view your results and a brief explanation. Also, we can discuss results at next follow-up visit.   Please schedule a Follow-up Appointment to: Return in about 6 months (around 10/29/2022) for 6 month fasting lab only then 1 week later Follow-up PreDM, HTN, Liver/HLD, specialists.  If you have any other questions or concerns, please feel free to call the office or send a message through [place]. You may also schedule an earlier appointment if necessary.  Additionally, you may be receiving a survey about your experience at our office within  a few days to 1 week by e-mail or mail. We value your feedback.  Ricky Putnam, DO [place]

## 2022-04-30 NOTE — Assessment & Plan Note (Signed)
Prior stable problem Last ferritin in normal range Will follow yearly

## 2022-04-30 NOTE — Progress Notes (Signed)
Subjective:    Patient ID: Ricky Mejia, male    DOB: [birth year], 52 y.o.   MRN: [id number]  Ricky Mejia is a 52 y.o. male presenting on 04/30/2022 for No chief complaint on file.   HPI  Here for Annual Physical and Lab Review  Elevated LFTs / Fatty Liver Hyperlipidemia, mixed Prior history with prior TG >200 and cholesterol elevated He has had dramatic improvement with his diet, limiting cholesterol, limiting red meat He has improved lab results Lipid panel TC 164, HDL 47, TG 106 and LDL 97 with improvement Still has AST ALT elevated down to 41 and 60 respectively. With improvement. He drinks skim milk.  Dry Skin Dermatitis Hands feet knees, thickened callus, often will have dirty dry hands Works with gloves, boots Using vaseline, lotion Failed topical steroid Waited 6 months now has Dermatology apt 05/05/22.    CHRONIC HTN: Reports history of high blood pressure for several years. Improved, not checking at home Current Meds - Amlodipine '10mg'$  daily   Reports good compliance, took meds today. Tolerating well, w/o complaints. Denies CP, dyspnea, HA, edema, dizziness / lightheadedness   Pre-Diabetes / Elevated A1c A1c 5.9, slightly inc from 5.8 prior  Former Smokeless Tobacco Quit 04/14/22, no more smokeless tobacco Using gum   Additional   Meralgia paresthetica Still has similar problem with pinched nerve. Not really improving but not worse Left Leg weakness, while on chair, felt brief episode of numbness.  Internal Hemorrhoids, bleeding, last seen on colonoscopy 2019. Has persistent bright red blood per rectum.  Health Maintenance:  Colon CA Screening - last Colonoscopy 01/07/18, 3 polyps and ulceration biopsy, done by [facility]. He is due 12/2022.  PSA 1.19,. last lab 04/2022      04/30/2022    3:33 PM 03/12/2021    2:56 PM 11/22/2018    9:31 AM  Depression screen PHQ 2/9  Decreased Interest 0 0 0  Down, Depressed, Hopeless 0 0 0  PHQ - 2 Score 0 0 0   Altered sleeping  0 1  Tired, decreased energy  0 1  Change in appetite  0 0  Feeling bad or failure about yourself   0 0  Trouble concentrating  0 1  Moving slowly or fidgety/restless  0 0  Suicidal thoughts  0 0  PHQ-9 Score  0 3  Difficult doing work/chores  Not difficult at all     Past Medical History:  Diagnosis Date   Abnormal LFTs    Allergic rhinitis    Diverticulitis    HUS (hemolytic uremic syndrome) (HCC)    Hyperplastic colon polyp    Hypertension    Obesity    Serrated adenoma of colon    T.T.P. syndrome ([place])    Past Surgical History:  Procedure Laterality Date   COLONOSCOPY     COLONOSCOPY WITH PROPOFOL N/A 01/07/2018   Procedure: COLONOSCOPY WITH PROPOFOL;  Surgeon: Lin Landsman, MD;  Location: [facility] ENDOSCOPY;  Service: Gastroenterology;  Laterality: N/A;   SIGMOIDOSCOPY     WISDOM TOOTH EXTRACTION     Social History   Socioeconomic History   Marital status: Divorced    Spouse name: Not on file   Number of children: Not on file   Years of education: Not on file   Highest education level: Not on file  Occupational History   Not on file  Tobacco Use   Smoking status: Never   Smokeless tobacco: Former    Types: Risk analyst  date: 04/14/2022  Vaping Use   Vaping Use: Never used  Substance and Sexual Activity   Alcohol use: No   Drug use: No   Sexual activity: Not on file  Other Topics Concern   Not on file  Social History Narrative   Not on file   Social Determinants of Health   Financial Resource Strain: Not on file  Food Insecurity: Not on file  Transportation Needs: Not on file  Physical Activity: Not on file  Stress: Not on file  Social Connections: Not on file  Intimate Partner Violence: Not on file   Family History  Problem Relation Age of Onset   Lupus Mother    Lung cancer Father    Diabetes Mellitus I Father    Cancer Father    Osteoarthritis Maternal Grandmother    Cancer Maternal Grandmother    Osteoarthritis  Maternal Grandfather    Colon cancer Maternal Grandfather    Osteoarthritis Paternal Grandmother    Osteoarthritis Paternal Grandfather    Current Outpatient Medications on File Prior to Visit  Medication Sig   cetirizine (ZYRTEC) 10 MG tablet Take 10 mg by mouth daily.   meloxicam (MOBIC) 15 MG tablet Take 1 tablet every day by oral route.   Multiple Vitamin (MULTIVITAMIN) tablet Take 1 tablet by mouth daily.   Triamcinolone Acetonide (TRIAMCINOLONE 0.1 % CREAM : EUCERIN) CREA Apply 1 Application topically 2 (two) times daily as needed for irritation (dry skin). For up to 2 weeks. May repeat as needed   No current facility-administered medications on file prior to visit.    Review of Systems  Constitutional:  Negative for activity change, appetite change, chills, diaphoresis, fatigue and fever.  HENT:  Negative for congestion and hearing loss.   Eyes:  Negative for visual disturbance.  Respiratory:  Negative for cough, chest tightness, shortness of breath and wheezing.   Cardiovascular:  Negative for chest pain, palpitations and leg swelling.  Gastrointestinal:  Negative for abdominal pain, constipation, diarrhea, nausea and vomiting.  Genitourinary:  Negative for dysuria, frequency and hematuria.  Musculoskeletal:  Negative for arthralgias and neck pain.  Skin:  Negative for rash.  Neurological:  Positive for weakness and numbness. Negative for dizziness, light-headedness and headaches.  Hematological:  Negative for adenopathy.  Psychiatric/Behavioral:  Negative for behavioral problems, dysphoric mood and sleep disturbance.    Per HPI unless specifically indicated above      Objective:    BP 136/84 (BP Location: Right Arm, Patient Position: Sitting, Cuff Size: Large)   Pulse 85   Temp (!) 97.1 F (36.2 C) (Temporal)   Ht '6\' 1"'$  (1.854 m)   Wt (!) 308 lb (139.7 kg)   SpO2 99%   BMI 40.64 kg/m   Wt Readings from Last 3 Encounters:  04/30/22 (!) 308 lb (139.7 kg)  10/21/21  (!) 309 lb 12.8 oz (140.5 kg)  08/14/21 (!) 302 lb (137 kg)    Physical Exam Vitals and nursing note reviewed.  Constitutional:      General: He is not in acute distress.    Appearance: He is well-developed. He is obese. He is not diaphoretic.     Comments: Well-appearing, comfortable, cooperative  HENT:     Head: Normocephalic and atraumatic.  Eyes:     General:        Right eye: No discharge.        Left eye: No discharge.     Conjunctiva/sclera: Conjunctivae normal.     Pupils: Pupils are equal, round,  and reactive to light.  Neck:     Thyroid: No thyromegaly.     Vascular: No carotid bruit.  Cardiovascular:     Rate and Rhythm: Normal rate and regular rhythm.     Pulses: Normal pulses.     Heart sounds: Normal heart sounds. No murmur heard. Pulmonary:     Effort: Pulmonary effort is normal. No respiratory distress.     Breath sounds: Normal breath sounds. No wheezing or rales.  Abdominal:     General: Bowel sounds are normal. There is no distension.     Palpations: Abdomen is soft. There is no mass.     Tenderness: There is no abdominal tenderness.  Musculoskeletal:        General: No tenderness. Normal range of motion.     Cervical back: Normal range of motion and neck supple.     Right lower leg: No edema.     Left lower leg: No edema.     Comments: Upper / Lower Extremities: - Normal muscle tone, strength bilateral upper extremities 5/5, lower extremities 5/5  Lymphadenopathy:     Cervical: No cervical adenopathy.  Skin:    General: Skin is warm and dry.     Findings: No erythema, lesion or rash.     Comments: Dry skin callus hands bilateral palm  Neurological:     Mental Status: He is alert and oriented to person, place, and time.     Comments: Distal sensation intact to light touch all extremities  Psychiatric:        Mood and Affect: Mood normal.        Behavior: Behavior normal.        Thought Content: Thought content normal.     Comments: Well groomed,  good eye contact, normal speech and thoughts     Results for orders placed or performed in visit on 04/22/22  TSH  Result Value Ref Range   TSH 1.19 0.40 - 4.50 mIU/L  PSA  Result Value Ref Range   PSA 1.19 < OR = 4.00 ng/mL  Hemoglobin A1c  Result Value Ref Range   Hgb A1c MFr Bld 5.9 (H) <5.7 % of total Hgb   Mean Plasma Glucose 123 mg/dL   eAG (mmol/L) 6.8 mmol/L  Lipid panel  Result Value Ref Range   Cholesterol 164 <200 mg/dL   HDL 47 > OR = 40 mg/dL   Triglycerides 106 <150 mg/dL   LDL Cholesterol (Calc) 97 mg/dL (calc)   Total CHOL/HDL Ratio 3.5 <5.0 (calc)   Non-HDL Cholesterol (Calc) 117 <130 mg/dL (calc)  CBC with Differential/Platelet  Result Value Ref Range   WBC 5.5 3.8 - 10.8 Thousand/uL   RBC 4.85 4.20 - 5.80 Million/uL   Hemoglobin 16.2 13.2 - 17.1 g/dL   HCT 45.8 38.5 - 50.0 %   MCV 94.4 80.0 - 100.0 fL   MCH 33.4 (H) 27.0 - 33.0 pg   MCHC 35.4 32.0 - 36.0 g/dL   RDW 12.7 11.0 - 15.0 %   Platelets 234 140 - 400 Thousand/uL   MPV 11.3 7.5 - 12.5 fL   Neutro Abs 3,553 1,500 - 7,800 cells/uL   Lymphs Abs 1,254 850 - 3,900 cells/uL   Absolute Monocytes 567 200 - 950 cells/uL   Eosinophils Absolute 77 15 - 500 cells/uL   Basophils Absolute 50 0 - 200 cells/uL   Neutrophils Relative % 64.6 %   Total Lymphocyte 22.8 %   Monocytes Relative 10.3 %   Eosinophils Relative  1.4 %   Basophils Relative 0.9 %  COMPLETE METABOLIC PANEL WITH GFR  Result Value Ref Range   Glucose, Bld 100 (H) 65 - 99 mg/dL   BUN 11 7 - 25 mg/dL   Creat 0.88 0.60 - 1.29 mg/dL   eGFR 105 > OR = 60 mL/min/1.28m   BUN/Creatinine Ratio SEE NOTE: 6 - 22 (calc)   Sodium 140 135 - 146 mmol/L   Potassium 4.5 3.5 - 5.3 mmol/L   Chloride 105 98 - 110 mmol/L   CO2 27 20 - 32 mmol/L   Calcium 9.2 8.6 - 10.3 mg/dL   Total Protein 6.9 6.1 - 8.1 g/dL   Albumin 4.2 3.6 - 5.1 g/dL   Globulin 2.7 1.9 - 3.7 g/dL (calc)   AG Ratio 1.6 1.0 - 2.5 (calc)   Total Bilirubin 0.6 0.2 - 1.2 mg/dL    Alkaline phosphatase (APISO) 81 36 - 130 U/L   AST 41 (H) 10 - 40 U/L   ALT 60 (H) 9 - 46 U/L      Assessment & Plan:   Problem List Items Addressed This Visit     Essential hypertension    Controlled BP - Home BP readings limited, goal to check at work and log  No known complications    Plan:  1.  Continue current BP regimen Amlodipine '10mg'$  daily, gave him verbal permission to consider use pill cutter for half dose '5mg'$  if want to trial, check BP monitor goal < 135/85 2. Encourage improved lifestyle - low sodium diet, regular exercise 3. Start monitor BP outside office, bring readings to next visit, if persistently >140/90 or new symptoms notify office sooner       Relevant Medications   amLODipine (NORVASC) 10 MG tablet   He[place]itary [place]ochrom[place]    Prior stable problem Last ferritin in normal range Will follow yearly      Internal hemorrhoid, bleeding   Relevant Medications   amLODipine (NORVASC) 10 MG tablet   Other Relevant Orders   Ambulatory referral to Gastroenterology   Pre-diabetes    Well-controlled Pre-DM with A1c 5.9, similar to prior results, slight increase Concern with obesity, HTN, HLD  Plan:  1. Not on any therapy currently  2. Encourage improved lifestyle - low carb, low sugar diet, reduce portion size, continue improving regular exercise       Other Visit Diagnoses     Annual physical exam    -  Primary   Hypertriglyceridemia       Relevant Medications   amLODipine (NORVASC) 10 MG tablet   Dry skin dermatitis       Meralgia paresthetica of both lower extremities       Relevant Orders   Ambulatory referral to Neurology   Other mononeuropathy       Relevant Orders   Ambulatory referral to Neurology   Paresthesia of left lower extremity       Relevant Orders   Ambulatory referral to Neurology       Updated Health Maintenance information Reviewed recent lab results with patient Encouraged improvement to lifestyle with diet and  exercise Goal of weight loss  #Internal Hemorrhoids, bleeding BRBPR chronic episodic, worsening Prior Colonoscopy 2019 identified int hemorrhoids History highly suggestive of this source of bleeding, without rectal or abdominal pain. No dark stools or melena. Will pursue GI referral back to [facility] for int hemorrhoid banding eval  #Meralgia Paresthetica / Paresthesia / Numbness Lower Ext L>R Persistent problem now for since 08/2021 Without  resolution, trial on medications Now has had some weakness episodic in left lower extremity w assoc numbness with paresthesia seems to be nerve impingement based on distribution. Had improved w wt loss then worse with wt gain Referral to [facility]rology for further evaluation EMG and management   Orders Placed This Encounter  Procedures   Ambulatory referral to Neurology    Referral Priority:   Routine    Referral Type:   Consultation    Referral Reason:   Specialty Services Required    Requested Specialty:   Neurology    Number of Visits Requested:   1   Ambulatory referral to Gastroenterology    Referral Priority:   Routine    Referral Type:   Consultation    Referral Reason:   Specialty Services Required    Number of Visits Requested:   1      Meds ordered this encounter  Medications   amLODipine (NORVASC) 10 MG tablet    Sig: Take 1 tablet (10 mg total) by mouth daily.    Dispense:  90 tablet    Refill:  3    Keep refills on file      Follow up plan: Return in about 6 months (around 10/29/2022) for 6 month fasting lab only then 1 week later Follow-up PreDM, HTN, Liver/HLD, specialists.  [name], [facility] 04/30/2022, 3:19 PM

## 2022-04-30 NOTE — Assessment & Plan Note (Signed)
Controlled BP - Home BP readings limited, goal to check at work and log  No known complications    Plan:  1.  Continue current BP regimen Amlodipine '10mg'$  daily, gave him verbal permission to consider use pill cutter for half dose '5mg'$  if want to trial, check BP monitor goal < 135/85 2. Encourage improved lifestyle - low sodium diet, regular exercise 3. Start monitor BP outside office, bring readings to next visit, if persistently >140/90 or new symptoms notify office sooner

## 2022-04-30 NOTE — Assessment & Plan Note (Signed)
Well-controlled Pre-DM with A1c 5.9, similar to prior results, slight increase Concern with obesity, HTN, HLD  Plan:  1. Not on any therapy currently  2. Encourage improved lifestyle - low carb, low sugar diet, reduce portion size, continue improving regular exercise

## 2022-05-01 ENCOUNTER — Ambulatory Visit: Payer: Managed Care, Other (non HMO) | Admitting: Dermatology

## 2022-05-05 ENCOUNTER — Ambulatory Visit: Payer: Managed Care, Other (non HMO) | Admitting: Dermatology

## 2022-05-05 DIAGNOSIS — Z79899 Other long term (current) drug therapy: Secondary | ICD-10-CM

## 2022-05-05 DIAGNOSIS — L409 Psoriasis, unspecified: Secondary | ICD-10-CM

## 2022-05-05 MED ORDER — ZORYVE 0.3 % EX CREA: 1.0000 "application " | TOPICAL_CREAM | Freq: Every day | CUTANEOUS | 3 refills | Status: DC

## 2022-05-05 MED ORDER — BETAMETHASONE DIPROPIONATE 0.05 % EX CREA: TOPICAL_CREAM | Freq: Every day | CUTANEOUS | 3 refills | Status: DC

## 2022-05-05 MED ORDER — TAZAROTENE 0.1 % EX CREA: TOPICAL_CREAM | Freq: Every day | CUTANEOUS | 3 refills | Status: DC

## 2022-05-05 NOTE — Progress Notes (Signed)
   Follow-Up Visit   Subjective  Ricky Mejia is a 52 y.o. male who presents for the following: Rash (Hands and feet x over a year - thickened skin. He got a lotion on [employer] and gets pedicures and that has helped his feet but the lotion is not helping his hands . 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  The following portions of the chart were reviewed this encounter and updated as appropriate:   Tobacco  Allergies  Meds  Problems  Med Hx  Surg Hx  Fam Hx     Review of Systems:  No other skin or systemic complaints except as noted in HPI or Assessment and Plan.  Objective  Well appearing patient in no apparent distress; mood and affect are within normal limits.  A focused examination was performed including hands, feet, legs. Relevant physical exam findings are noted in the Assessment and Plan.  Hands, feet, right knee Fissures and plaques                Assessment & Plan  Psoriasis Hands, feet, right knee See photos Counseling on psoriasis and coordination of care  psoriasis is a chronic non-curable, but treatable genetic/hereditary disease that may have other systemic features affecting other organ systems such as joints (Psoriatic Arthritis). It is associated with an increased risk of inflammatory bowel disease, heart disease, non-alcoholic fatty liver disease, and depression.  Treatments include light and laser treatments; topical medications; and systemic medications including oral and injectables.  Start : Zoryve cream qam,  Tazarotene 0.1% cream followed by Betamethasone cream qhs to hands, feet, right knee May consider Otezla on follow up.  tazarotene (TAZORAC) 0.1 % cream - Hands, feet, right knee Apply topically at bedtime. betamethasone dipropionate 0.05 % cream - Hands, feet, right knee Apply topically at bedtime. Apply over tazarotene cream at bedtime to hands, feet and right  knee Roflumilast (ZORYVE) 0.3 % CREA - Hands, feet, right knee Apply 1 application  topically daily.  Return in about 2 months (around 07/04/2022) for Psoriasis.  I, Ashok Cordia, CMA, am acting as scribe for Sarina Ser, MD . Documentation: I have reviewed the above documentation for accuracy and completeness, and I agree with the above.  Sarina Ser, MD

## 2022-05-05 NOTE — Patient Instructions (Signed)
Your prescription was sent to [facility] in [place]. A representative from [place] will contact you within 3 business hours to verify your address and insurance information to schedule a free delivery. If for any reason you do not receive a phone call from them, please reach out to them. Their phone number is [phone] and their hours are Monday-Friday 9:00 am-5:00 pm.    Counseling on psoriasis and coordination of care  psoriasis is a chronic non-curable, but treatable genetic/hereditary disease that may have other systemic features affecting other organ systems such as joints (Psoriatic Arthritis). It is associated with an increased risk of inflammatory bowel disease, heart disease, non-alcoholic fatty liver disease, and depression.  Treatments include light and laser treatments; topical medications; and systemic medications including oral and injectables.    Due to recent changes in healthcare laws, you may see results of your pathology and/or laboratory studies on MyChart before the doctors have had a chance to review them. We understand that in some cases there may be results that are confusing or concerning to you. Please understand that not all results are received at the same time and often the doctors may need to interpret multiple results in order to provide you with the best plan of care or course of treatment. Therefore, we ask that you please give [place] 2 business days to thoroughly review all your results before contacting the office for clarification. Should we see a critical lab result, you will be contacted sooner.   If You Need Anything After Your Visit  If you have any questions or concerns for your doctor, please call our main line at [phone] and press option 4 to reach your doctor's medical assistant. If no one answers, please leave a voicemail as directed and we will return your call as soon as possible. Messages left after 4 pm will be answered the following  business day.   You may also send [place] a message via [place]. We typically respond to MyChart messages within 1-2 business days.  For prescription refills, please ask your pharmacy to contact our office. Our fax number is [phone].  If you have an urgent issue when the clinic is closed that cannot wait until the next business day, you can page your doctor at the number below.    Please note that while we do our best to be available for urgent issues outside of office hours, we are not available 24/7.   If you have an urgent issue and are unable to reach [place], you may choose to seek medical care at your doctor's office, retail clinic, urgent care center, or emergency room.  If you have a medical emergency, please immediately call 911 or go to the emergency department.  Pager Numbers  - Dr. Nehemiah Massed: [phone]  - Dr. Laurence Ferrari: [phone]  - Dr. Nicole Kindred: [phone]  In the event of inclement weather, please call our main line at [phone] for an update on the status of any delays or closures.  Dermatology Medication Tips: Please keep the boxes that topical medications come in in order to help keep track of the instructions about where and how to use these. Pharmacies typically print the medication instructions only on the boxes and not directly on the medication tubes.   If your medication is too expensive, please contact our office at [phone] option 4 or send [place] a message through [place].   We are unable to tell what your co-pay for medications will be in advance as this is different  depending on your insurance coverage. However, we may be able to find a substitute medication at lower cost or fill out paperwork to get insurance to cover a needed medication.   If a prior authorization is required to get your medication covered by your insurance company, please allow [place] 1-2 business days to complete this process.  Drug prices often vary depending on where the prescription is  filled and some pharmacies may offer cheaper prices.  The website www.goodrx.com contains coupons for medications through different pharmacies. The prices here do not account for what the cost may be with help from insurance (it may be cheaper with your insurance), but the website can give you the price if you did not use any insurance.  - You can print the associated coupon and take it with your prescription to the pharmacy.  - You may also stop by our office during regular business hours and pick up a GoodRx coupon card.  - If you need your prescription sent electronically to a different pharmacy, notify our office through [facility] or by phone at [phone] option 4.     Si Usted Necesita Algo Despus de Su Visita  Tambin puede enviarnos un mensaje a travs de Pharmacist, community. Por lo general respondemos a los mensajes de MyChart en el transcurso de 1 a 2 das hbiles.  Para renovar recetas, por favor pida a su farmacia que se ponga en contacto con nuestra oficina. Harland Dingwall de fax es [place] [phone].  Si tiene un asunto urgente cuando la clnica est cerrada y que no puede esperar hasta el siguiente da hbil, puede llamar/localizar a su doctor(a) al nmero que aparece a continuacin.   Por favor, tenga en cuenta que aunque hacemos todo lo posible para estar disponibles para asuntos urgentes fuera del horario de Nada, no estamos disponibles las 24 horas del da, los 7 das de la [place].   Si tiene un problema urgente y no puede comunicarse con nosotros, puede optar por buscar atencin mdica  en el consultorio de su doctor(a), en una clnica privada, en un centro de atencin urgente o en una sala de emergencias.  Si tiene Engineering geologist, por favor llame inmediatamente al 911 o vaya a la sala de emergencias.  Nmeros de bper  - Dr. Nehemiah Massed: [phone]  - Dra. Moye: [phone]  - Dra. Nicole Kindred: [phone]  En caso de inclemencias del [place], por favor llame  a Johnsie Kindred principal al [phone] para una actualizacin sobre el [place] de cualquier retraso o cierre.  Consejos para la medicacin en dermatologa: Por favor, guarde las cajas en las que vienen los medicamentos de uso tpico para ayudarle a seguir las instrucciones sobre dnde y cmo usarlos. Las farmacias generalmente imprimen las instrucciones del medicamento slo en las cajas y no directamente en los tubos del [place].   Si su medicamento es muy caro, por favor, pngase en contacto con Zigmund Daniel llamando al [phone] y presione la opcin 4 o envenos un mensaje a travs de Pharmacist, community.   No podemos decirle cul ser su copago por los medicamentos por adelantado ya que esto es diferente dependiendo de la cobertura de su seguro. Sin embargo, es posible que podamos encontrar un medicamento sustituto a Electrical engineer un formulario para que el seguro cubra el medicamento que se considera necesario.   Si se requiere una autorizacin previa para que su compaa de seguros [place] su medicamento, por favor permtanos de 1 a 2 das hbiles para [employer]  proceso.  Los precios de los medicamentos varan con frecuencia dependiendo del Environmental consultant de dnde se surte la receta y alguna farmacias pueden ofrecer precios ms baratos.  El sitio web www.goodrx.com tiene cupones para medicamentos de Airline pilot. Los precios aqu no tienen en cuenta lo que podra costar con la ayuda del seguro (puede ser ms barato con su seguro), pero el sitio web puede darle el precio si no utiliz Research scientist (physical sciences).  - Puede imprimir el cupn correspondiente y llevarlo con su receta a la farmacia.  - Tambin puede pasar por nuestra oficina durante el horario de atencin regular y Charity fundraiser una tarjeta de cupones de GoodRx.  - Si necesita que su receta se enve electrnicamente a una farmacia diferente, informe a nuestra oficina a travs de MyChart de [place] o por telfono llamando al [phone] y  presione la opcin 4.

## 2022-05-14 ENCOUNTER — Encounter: Payer: Self-pay | Admitting: Family Medicine

## 2022-05-14 ENCOUNTER — Encounter: Payer: Self-pay | Admitting: Dermatology

## 2022-05-14 DIAGNOSIS — L409 Psoriasis, unspecified: Secondary | ICD-10-CM | POA: Insufficient documentation

## 2022-05-23 ENCOUNTER — Encounter: Payer: Self-pay | Admitting: Emergency Medicine

## 2022-05-23 ENCOUNTER — Emergency Department: Payer: Managed Care, Other (non HMO)

## 2022-05-23 ENCOUNTER — Other Ambulatory Visit: Payer: Self-pay

## 2022-05-23 ENCOUNTER — Emergency Department
Admission: EM | Admit: 2022-05-23 | Discharge: 2022-05-23 | Disposition: A | Discharge status: Home / Self Care | Payer: Managed Care, Other (non HMO) | Attending: Emergency Medicine | Admitting: Emergency Medicine

## 2022-05-23 DIAGNOSIS — M545 Low back pain, unspecified: Secondary | ICD-10-CM | POA: Diagnosis not present

## 2022-05-23 LAB — URINALYSIS, ROUTINE W REFLEX MICROSCOPIC
Bilirubin Urine: NEGATIVE
Glucose, UA: NEGATIVE mg/dL
Hgb urine dipstick: NEGATIVE
Ketones, ur: NEGATIVE mg/dL
Leukocytes,Ua: NEGATIVE
Nitrite: NEGATIVE
Protein, ur: NEGATIVE mg/dL
Specific Gravity, Urine: 1.025 (ref 1.005–1.030)
pH: 5 (ref 5.0–8.0)

## 2022-05-23 MED ORDER — KETOROLAC TROMETHAMINE 30 MG/ML IJ SOLN
30.0000 mg | Freq: Once | INTRAMUSCULAR | Status: AC
  Administered 2022-05-23: 30 mg via INTRAMUSCULAR
  Filled 2022-05-23: qty 1

## 2022-05-23 MED ORDER — ETODOLAC 400 MG PO TABS: 400.0000 mg | ORAL_TABLET | Freq: Two times a day (BID) | ORAL | 0 refills | Status: DC

## 2022-05-23 NOTE — ED Notes (Signed)
See triage note  Presents with lower back pain  States pain started 2 days ago w/o injury States he does have some numbness to both legs  Sx's are worse in the mornings  Denies any urinary sx's

## 2022-05-23 NOTE — ED Triage Notes (Signed)
Pt presents to ED via POV. Pt reports back pain x2 days. Pt denies any changes with urination. Pt denies pain with urination. Pt states that the pain is worse in the morning and levels throughout the day. Pt reports using NSAIDs for pain relief but denies them relieving the pain. 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 Pt A&Ox4

## 2022-05-23 NOTE — Discharge Instructions (Addendum)
Follow with your primary care provider if any continued problems or concerns.  A prescription for etodolac was sent to the pharmacy for you to begin taking twice a day with food.  You may use ice or heat to your back as needed for discomfort.

## 2022-05-23 NOTE — ED Provider Notes (Signed)
[facility] Provider Note    Event Date/Time   First MD Initiated Contact with Patient 05/23/22 [phone]     (approximate)   History   Back Pain   HPI  Ricky Mejia is a 52 y.o. male   presents to the ED with complaint of low back pain for the last 2 days without history of paresthesias, sciatica, urinary symptoms, incontinence of bowel or bladder.  Patient states that he has been taking NSAIDs without any relief.  He denies any injury to his back.  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      Physical Exam   Triage Vital Signs: ED Triage Vitals  Enc Vitals Group     BP 05/23/22 0934 124/83     Pulse Rate 05/23/22 0934 90     Resp 05/23/22 0934 17     Temp 05/23/22 0934 98.5 F (36.9 C)     Temp Source 05/23/22 0934 Oral     SpO2 05/23/22 0934 96 %     Weight 05/23/22 0943 (!) 307 lb 15.7 oz (139.7 kg)     Height 05/23/22 0943 6' 1"$  (1.854 m)     Head Circumference --      Peak Flow --      Pain Score --      Pain Loc --      Pain Edu? --      Excl. in [place]? --     Most recent vital signs: Vitals:   05/23/22 0934 05/23/22 1227  BP: 124/83 120/78  Pulse: 90 88  Resp: 17 16  Temp: 98.5 F (36.9 C)   SpO2: 96% 96%     General: Awake, no distress.  CV:  Good peripheral perfusion.  Resp:  Normal effort.  Abd:  No distention.  Other:  Examination of the lumbar spine there is no gross deformity and no point tenderness on palpation of the spinous processes.  There is more tenderness to the right paravertebral muscles than the left.  Range of motion is slow and guarded secondary to discomfort.  Patient is able to stand and ambulate without any assistance but states that getting up increases his discomfort until he is actually standing.  Muscle strength bilaterally at 5/5.   ED Results / Procedures / Treatments   Labs (all labs ordered are listed, but only abnormal results are  displayed) Labs Reviewed  URINALYSIS, ROUTINE W REFLEX MICROSCOPIC - Abnormal; Notable for the following components:      Result Value   Color, Urine YELLOW (*)    APPearance CLEAR (*)    All other components within normal limits     RADIOLOGY Lumbar spine x-ray images were reviewed by myself independent of the radiologist and multiple spurring was noted.  Radiology report reads the same.    PROCEDURES:  Critical Care performed:   Procedures   MEDICATIONS ORDERED IN ED: Medications  ketorolac (TORADOL) 30 MG/ML injection 30 mg (30 mg Intramuscular Given 05/23/22 1159)     IMPRESSION / MDM / [place] / ED COURSE  I reviewed the triage vital signs and the nursing notes.   Differential diagnosis includes, but is not limited to, low back pain, lumbar strain, degenerative disc disease, compression fracture, urinary tract infection, urolithiasis.  52 year old male presents to the ED with complaint of low back pain without known injury.  Patient states he has taken over-the-counter medication  without any relief.  X-rays showed spurring at multiple levels but no bony abnormality suggesting trauma.  Patient was given Toradol 30 mg IM and a prescription for etodolac 400 mg was sent to his pharmacy to take twice a day with food.  Patient was given a note to remain out of work for the next 2 days.  He is also encouraged to use ice or heat to his back as needed and follow-up with his PCP if any continued problems.       Patient's presentation is most consistent with acute complicated illness / injury requiring diagnostic workup.  FINAL CLINICAL IMPRESSION(S) / ED DIAGNOSES   Final diagnoses:  Acute low back pain without sciatica, unspecified back pain laterality     Rx / DC Orders   ED Discharge Orders          Ordered    etodolac (LODINE) 400 MG tablet  2 times daily        05/23/22 1151             Note:  This document was prepared using Dragon voice  recognition software and may include unintentional dictation errors.   Johnn Hai, PA-C 05/23/22 1330    Carrie Mew, MD 05/23/22 1455

## 2022-07-10 ENCOUNTER — Ambulatory Visit (INDEPENDENT_AMBULATORY_CARE_PROVIDER_SITE_OTHER): Payer: Managed Care, Other (non HMO) | Admitting: Dermatology

## 2022-07-10 VITALS — BP 138/83 | HR 103

## 2022-07-10 DIAGNOSIS — L409 Psoriasis, unspecified: Secondary | ICD-10-CM | POA: Diagnosis not present

## 2022-07-10 DIAGNOSIS — Z79899 Other long term (current) drug therapy: Secondary | ICD-10-CM | POA: Diagnosis not present

## 2022-07-10 MED ORDER — OTEZLA 30 MG PO TABS: 30.0000 mg | ORAL_TABLET | Freq: Two times a day (BID) | ORAL | 3 refills | Status: DC

## 2022-07-10 NOTE — Progress Notes (Signed)
   Follow-Up Visit   Subjective  Ricky Mejia is a 52 y.o. male who presents for the following: Psoriasis. Hands, feet, right knee, right elbow. Zoryve and Tazarotene are not helping at all.  The following portions of the chart were reviewed this encounter and updated as appropriate: medications, allergies, medical history  Review of Systems:  No other skin or systemic complaints except as noted in HPI or Assessment and Plan.  Objective  Well appearing patient in no apparent distress; mood and affect are within normal limits.  Areas Examined: Hands, elbows  Relevant exam findings are noted in the Assessment and Plan.     Assessment & Plan   Psoriasis  Related Medications tazarotene (TAZORAC) 0.1 % cream Apply topically at bedtime.  betamethasone dipropionate 0.05 % cream Apply topically at bedtime. Apply over tazarotene cream at bedtime to hands, feet and right knee  Roflumilast (ZORYVE) 0.3 % CREA Apply 1 application  topically daily.  Medication management  Long-term use of high-risk medication   PSORIASIS Well-demarcated erythematous papules/plaques with silvery scale, guttate pink scaly papules. 15% BSA. Chronic and persistent condition with duration or expected duration over one year. Condition is bothersome/symptomatic for patient. Currently flared.  Treatment Plan: Start [place] as directed in sample packs. 3 Sample packs given today. Start Otezla 30 mg twice daily.   If not improving with [place] N[place] consider biologic.   Side effects of Otezla (apremilast) include diarrhea, nausea, headache, upper respiratory infection, depression, and weight decrease (5-10%). It should only be taken by pregnant women after a discussion regarding risks and benefits with their doctor. Goal is control of skin condition, not cure.  The use of [place] N[place] requires long term medication management, including periodic office visits.  States has Hx of depression.   Counseling on  psoriasis and coordination of care  psoriasis is a chronic non-curable, but treatable genetic/hereditary disease that may have other systemic features affecting other organ systems such as joints (Psoriatic Arthritis). It is associated with an increased risk of inflammatory bowel disease, heart disease, non-alcoholic fatty liver disease, and depression.  Treatments include light and laser treatments; topical medications; and systemic medications including oral and injectables.                   Return for Psoriasis Follow Up  2-3 months.  I, Emelia Salisbury, CMA, am acting as scribe for Sarina Ser, MD.  Documentation: I have reviewed the above documentation for accuracy and completeness, and I agree with the above.  Sarina Ser, MD

## 2022-07-10 NOTE — Patient Instructions (Signed)
Start [place] as directed in sample packs. 2 Sample packs given today. Start Otezla 30 mg twice daily.   If not improving with [place] consider biologic.   Side effects of Otezla (apremilast) include diarrhea, nausea, headache, upper respiratory infection, depression, and weight decrease (5-10%). It should only be taken by pregnant women after a discussion regarding risks and benefits with their doctor. Goal is control of skin condition, not cure.  The use of [place] requires long term medication management, including periodic office visits.   Due to recent changes in healthcare laws, you may see results of your pathology and/or laboratory studies on MyChart before the doctors have had a chance to review them. We understand that in some cases there may be results that are confusing or concerning to you. Please understand that not all results are received at the same time and often the doctors may need to interpret multiple results in order to provide you with the best plan of care or course of treatment. Therefore, we ask that you please give [place] 2 business days to thoroughly review all your results before contacting the office for clarification. Should we see a critical lab result, you will be contacted sooner.   If You Need Anything After Your Visit  If you have any questions or concerns for your doctor, please call our main line at [phone] and press option 4 to reach your doctor's medical assistant. If no one answers, please leave a voicemail as directed and we will return your call as soon as possible. Messages left after 4 pm will be answered the following business day.   You may also send [place] a message via [place]. We typically respond to MyChart messages within 1-2 business days.  For prescription refills, please ask your pharmacy to contact our office. Our fax number is [phone].  If you have an urgent issue when the clinic is closed that cannot wait until the next business day, you  can page your doctor at the number below.    Please note that while we do our best to be available for urgent issues outside of office hours, we are not available 24/7.   If you have an urgent issue and are unable to reach [place], you may choose to seek medical care at your doctor's office, retail clinic, urgent care center, or emergency room.  If you have a medical emergency, please immediately call 911 or go to the emergency department.  Pager Numbers  - Dr. Nehemiah Massed: [phone]  - Dr. Laurence Ferrari: [phone]  - Dr. Nicole Kindred: [phone]  In the event of inclement weather, please call our main line at [phone] for an update on the status of any delays or closures.  Dermatology Medication Tips: Please keep the boxes that topical medications come in in order to help keep track of the instructions about where and how to use these. Pharmacies typically print the medication instructions only on the boxes and not directly on the medication tubes.   If your medication is too expensive, please contact our office at [phone] option 4 or send [place] a message through [place].   We are unable to tell what your co-pay for medications will be in advance as this is different depending on your insurance coverage. However, we may be able to find a substitute medication at lower cost or fill out paperwork to get insurance to cover a needed medication.   If a prior authorization is required to get your medication covered by your insurance company, please allow  [place] 1-2 business days to complete this process.  Drug prices often vary depending on where the prescription is filled and some pharmacies may offer cheaper prices.  The website www.goodrx.com contains coupons for medications through different pharmacies. The prices here do not account for what the cost may be with help from insurance (it may be cheaper with your insurance), but the website can give you the price if you did not use any insurance.  -  You can print the associated coupon and take it with your prescription to the pharmacy.  - You may also stop by our office during regular business hours and pick up a GoodRx coupon card.  - If you need your prescription sent electronically to a different pharmacy, notify our office through [facility] or by phone at [phone] option 4.     Si Usted Necesita Algo Despus de Su Visita  Tambin puede enviarnos un mensaje a travs de Pharmacist, community. Por lo general respondemos a los mensajes de MyChart en el transcurso de 1 a 2 das hbiles.  Para renovar recetas, por favor pida a su farmacia que se ponga en contacto con nuestra oficina. Harland Dingwall de fax es [place] [phone].  Si tiene un asunto urgente cuando la clnica est cerrada y que no puede esperar hasta el siguiente da hbil, puede llamar/localizar a su doctor(a) al nmero que aparece a continuacin.   Por favor, tenga en cuenta que aunque hacemos todo lo posible para estar disponibles para asuntos urgentes fuera del horario de [place], no estamos disponibles las 24 horas del da, los 7 das de la [place].   Si tiene un problema urgente y no puede comunicarse con nosotros, puede optar por buscar atencin mdica  en el consultorio de su doctor(a), en una clnica privada, en un centro de atencin urgente o en una sala de emergencias.  Si tiene Engineering geologist, por favor llame inmediatamente al 911 o vaya a la sala de emergencias.  Nmeros de bper  - Dr. Nehemiah Massed: [phone]  - Dra. Moye: [phone]  - Dra. Nicole Kindred: [phone]  En caso de inclemencias del [place], por favor llame a Johnsie Kindred principal al [phone] para una actualizacin sobre el [place] de cualquier retraso o cierre.  Consejos para la medicacin en dermatologa: Por favor, guarde las cajas en las que vienen los medicamentos de uso tpico para ayudarle a seguir las instrucciones sobre dnde y cmo usarlos. Las farmacias generalmente imprimen  las instrucciones del medicamento slo en las cajas y no directamente en los tubos del [place].   Si su medicamento es muy caro, por favor, pngase en contacto con Zigmund Daniel llamando al [phone] y presione la opcin 4 o envenos un mensaje a travs de Pharmacist, community.   No podemos decirle cul ser su copago por los medicamentos por adelantado ya que esto es diferente dependiendo de la cobertura de su seguro. Sin embargo, es posible que podamos encontrar un medicamento sustituto a Electrical engineer un formulario para que el seguro cubra el medicamento que se considera necesario.   Si se requiere una autorizacin previa para que su compaa de seguros [place] su medicamento, por favor permtanos de 1 a 2 das hbiles para completar este proceso.  Los precios de los medicamentos varan con frecuencia dependiendo del Environmental consultant de dnde se surte la receta y alguna farmacias pueden ofrecer precios ms baratos.  El sitio web www.goodrx.com tiene cupones para medicamentos de Airline pilot. Los precios aqu no tienen en cuenta lo que podra  lo que podra costar con la ayuda del seguro (puede ser ms barato con su seguro), pero el sitio web puede darle el precio si no utiliz ningn seguro.  - Puede imprimir el cupn correspondiente y llevarlo con su receta a la farmacia.  - Tambin puede pasar por nuestra oficina durante el horario de atencin regular y recoger una tarjeta de cupones de GoodRx.  - Si necesita que su receta se enve electrnicamente a una farmacia diferente, informe a nuestra oficina a travs de MyChart de [place] o por telfono llamando al [phone] y presione la opcin 4.  

## 2022-07-15 ENCOUNTER — Encounter: Payer: Self-pay | Admitting: Dermatology

## 2022-07-15 ENCOUNTER — Telehealth: Payer: Self-pay

## 2022-07-15 NOTE — Telephone Encounter (Signed)
Patient called he started [place] 1 week ago and he has had a extremely bad attitude since he started [place], he would like something different.

## 2022-07-16 ENCOUNTER — Telehealth: Payer: Self-pay

## 2022-07-16 NOTE — Telephone Encounter (Signed)
LMOVM please return my call.      See phone call note from 07/15/2022- Dr Raliegh Ip

## 2022-07-17 ENCOUNTER — Telehealth: Payer: Self-pay

## 2022-07-17 NOTE — Telephone Encounter (Signed)
Left patient message to return call from Dr. Nehemiah Massed and Ricky Mejia's previous telephone encounters. aw

## 2022-07-21 ENCOUNTER — Telehealth: Payer: Self-pay

## 2022-07-21 ENCOUNTER — Other Ambulatory Visit: Payer: Self-pay

## 2022-07-21 DIAGNOSIS — L400 Psoriasis vulgaris: Secondary | ICD-10-CM

## 2022-07-21 NOTE — Progress Notes (Signed)
Discussed Dr. Philemon Kingdom recommendation. Advised patient that lab requisition will be at check in desk for him to pick up.

## 2022-07-21 NOTE — Telephone Encounter (Signed)
LM on VM please return my call, we need to order additional labs so that we can start a new rx for psoriasis

## 2022-07-26 LAB — QUANTIFERON-TB GOLD PLUS
QuantiFERON Mitogen Value: 7.59 IU/mL
QuantiFERON Nil Value: 0 IU/mL
QuantiFERON TB1 Ag Value: 0.01 IU/mL
QuantiFERON TB2 Ag Value: 0.01 IU/mL
QuantiFERON-TB Gold Plus: NEGATIVE

## 2022-07-26 LAB — HEPATITIS B SURFACE ANTIBODY,QUALITATIVE: Hep B Surface Ab, Qual: REACTIVE

## 2022-07-26 LAB — HEPATITIS C ANTIBODY: Hep C Virus Ab: NONREACTIVE

## 2022-07-26 LAB — HEPATITIS B SURFACE ANTIGEN: Hepatitis B Surface Ag: NEGATIVE

## 2022-07-28 ENCOUNTER — Telehealth: Payer: Self-pay

## 2022-07-28 NOTE — Telephone Encounter (Signed)
-----   Message from Deirdre Evener, MD sent at 07/27/2022  6:54 PM EDT ----- Labs from 07/22/2022 showed: QuantiFERON gold/TB test = negative/normal Hepatitis C and B testing was normal.  Patient had previous CBC and chemistries were unremarkable  Please confirm that patient has no risk for HIV.  And document this. We will need to do some additional hepatitis B testing in the future.  Please review what I have recommended ordering.  We may go ahead and order location treatment for this patient psoriasis. I would recommend we order Tremfya if this is covered by insurance. Please send in [facility]

## 2022-07-28 NOTE — Telephone Encounter (Signed)
LVM to return my call °

## 2022-07-31 ENCOUNTER — Encounter: Payer: Self-pay | Admitting: Family Medicine

## 2022-07-31 ENCOUNTER — Ambulatory Visit (INDEPENDENT_AMBULATORY_CARE_PROVIDER_SITE_OTHER): Payer: Managed Care, Other (non HMO) | Admitting: Family Medicine

## 2022-07-31 ENCOUNTER — Telehealth: Payer: Self-pay

## 2022-07-31 VITALS — BP 128/94 | HR 93 | Ht 73.0 in | Wt 315.0 lb

## 2022-07-31 DIAGNOSIS — I83813 Varicose veins of bilateral lower extremities with pain: Secondary | ICD-10-CM

## 2022-07-31 DIAGNOSIS — I10 Essential (primary) hypertension: Secondary | ICD-10-CM

## 2022-07-31 DIAGNOSIS — R600 Localized edema: Secondary | ICD-10-CM

## 2022-07-31 DIAGNOSIS — I872 Venous insufficiency (chronic) (peripheral): Secondary | ICD-10-CM | POA: Diagnosis not present

## 2022-07-31 MED ORDER — TREMFYA 100 MG/ML ~~LOC~~ SOAJ: 100.0000 mg | SUBCUTANEOUS | 0 refills | Status: DC

## 2022-07-31 MED ORDER — HYDROCHLOROTHIAZIDE 25 MG PO TABS: 25.0000 mg | ORAL_TABLET | Freq: Every day | ORAL | 2 refills | Status: DC

## 2022-07-31 MED ORDER — TREMFYA 100 MG/ML ~~LOC~~ SOAJ: 100.0000 mg | SUBCUTANEOUS | 3 refills | Status: DC

## 2022-07-31 NOTE — Telephone Encounter (Signed)
-----   Message from David C Kowalski, MD sent at 07/27/2022  6:54 PM EDT ----- Labs from 07/22/2022 showed: QuantiFERON gold/TB test = negative/normal Hepatitis C and B testing was normal.  Patient had previous CBC and chemistries were unremarkable  Please confirm that patient has no risk for HIV.  And document this. We will need to do some additional hepatitis B testing in the future.  Please review what I have recommended ordering.  We may go ahead and order location treatment for this patient psoriasis. I would recommend we order Tremfya if this is covered by insurance. Please send in Tremfya 

## 2022-07-31 NOTE — Telephone Encounter (Signed)
Patient was at PCP office today. Communicated with Dr. Althea Charon regarding patient's lab work and [place] being sent in.   Per PCP low risk for HIV.  PCP advised MAY order additional HIV testing in the future at follow up appointment.

## 2022-07-31 NOTE — Patient Instructions (Addendum)
Thank you for coming to the office today.  Stop taking Amlodipine I think it is worsening the swelling.  Start taking Hydrochlorothiazide  daily, it will help with swelling.  Use RICE therapy: - R - Rest / relative rest with activity modification avoid overuse of joint - I - Ice packs (make sure you use a towel or sock / something to protect skin) - C - Compression with ACE wrap to apply pressure and reduce swelling allowing more support - E - Elevation - if significant swelling, lift leg above heart level (toes above your nose) to help reduce swelling, most helpful at night after day of being on your feet   -------  Referral to Vascular Surgery  [facility] [address] [place],  [place]  [address] Main: [phone]  Please schedule a Follow-up Appointment to: Return if symptoms worsen or fail to improve.  If you have any other questions or concerns, please feel free to call the office or send a message through MyChart. You may also schedule an earlier appointment if necessary.  Additionally, you may be receiving a survey about your experience at our office within a few days to 1 week by e-mail or mail. We value your feedback.  Saralyn Pilar, DO [facility], [place]

## 2022-07-31 NOTE — Progress Notes (Signed)
Subjective:    Patient ID: Ricky Mejia, male    DOB: [birth year], 52 y.o.   MRN: [id number]  Ricky Mejia is a 52 y.o. male presenting on 07/31/2022 for Leg Swelling (Left leg, for a year)  Patient presents for a same day appointment.  HPI  Bilateral Lower Extremity Edema Venous Insufficiency L>R leg swelling worse On feet long hours. Not elevating or using compression. On Amlodipine for BP Not on diuretic  Psoriasis, Plaque Bilateral hands, feet, toes He is followed by [facility] They are working on ordering Tremfaya now for him. He has done recent labs.       07/31/2022   11:26 AM 04/30/2022    3:33 PM 03/12/2021    2:56 PM  Depression screen PHQ 2/9  Decreased Interest 0 0 0  Down, Depressed, Hopeless 0 0 0  PHQ - 2 Score 0 0 0  Altered sleeping 0  0  Tired, decreased energy 0  0  Change in appetite 0  0  Feeling bad or failure about yourself  0  0  Trouble concentrating 0  0  Moving slowly or fidgety/restless 0  0  Suicidal thoughts 0  0  PHQ-9 Score 0  0  Difficult doing work/chores   Not difficult at all    Social History   Tobacco Use   Smoking status: Never   Smokeless tobacco: Former    Types: Chew    Quit date: 04/14/2022  Vaping Use   Vaping Use: Never used  Substance Use Topics   Alcohol use: No   Drug use: No    Review of Systems Per HPI unless specifically indicated above     Objective:    BP (!) 128/94   Pulse 93   Ht  (1.854 m)   Wt (!) 315 lb (142.9 kg)   BMI 41.56 kg/m   Wt Readings from Last 3 Encounters:  07/31/22 (!) 315 lb (142.9 kg)  05/23/22 (!) 307 lb 15.7 oz (139.7 kg)  04/30/22 (!) 308 lb (139.7 kg)    Physical Exam Vitals and nursing note reviewed.  Constitutional:      General: He is not in acute distress.    Appearance: He is well-developed. He is obese. He is not diaphoretic.     Comments: Well-appearing, comfortable, cooperative  HENT:     Head: Normocephalic and  atraumatic.  Eyes:     General:        Right eye: No discharge.        Left eye: No discharge.     Conjunctiva/sclera: Conjunctivae normal.  Neck:     Thyroid: No thyromegaly.  Cardiovascular:     Rate and Rhythm: Normal rate and regular rhythm.     Pulses: Normal pulses.     Heart sounds: Normal heart sounds. No murmur heard. Pulmonary:     Effort: Pulmonary effort is normal. No respiratory distress.     Breath sounds: Normal breath sounds. No wheezing or rales.  Musculoskeletal:        General: Normal range of motion.     Cervical back: Normal range of motion and neck supple.     Right lower leg: Edema present.     Left lower leg: Edema (+2-3 pitting bilateral, varicose veins) present.     Comments: Hands and feet with plaque formation L great toenail with cloudy nail appearance and discoloration  Lymphadenopathy:     Cervical: No cervical adenopathy.  Skin:  General: Skin is warm and dry.     Findings: No erythema or rash.  Neurological:     Mental Status: He is alert and oriented to person, place, and time. Mental status is at baseline.  Psychiatric:        Behavior: Behavior normal.     Comments: Well groomed, good eye contact, normal speech and thoughts      Results for orders placed or performed in visit on 07/21/22  QuantiFERON-TB Gold Plus  Result Value Ref Range   QuantiFERON Incubation Incubation performed.    QuantiFERON Criteria Comment    QuantiFERON TB1 Ag Value 0.01 IU/mL   QuantiFERON TB2 Ag Value 0.01 IU/mL   QuantiFERON Nil Value 0.00 IU/mL   QuantiFERON Mitogen Value 7.59 IU/mL   QuantiFERON-TB Gold Plus Negative Negative  Hep C Antibody  Result Value Ref Range   Hep C Virus Ab Non Reactive Non Reactive  Hep B Surface Antibody  Result Value Ref Range   Hep B Surface Ab, Qual Reactive   Hep B Surface Antigen  Result Value Ref Range   Hepatitis B Surface Ag Negative Negative      Assessment & Plan:   Problem List Items Addressed This  Visit     Essential hypertension   Relevant Medications   hydrochlorothiazide (HYDRODIURIL) 25 MG tablet   Other Visit Diagnoses     Bilateral lower extremity edema    -  Primary   Relevant Medications   hydrochlorothiazide (HYDRODIURIL) 25 MG tablet   Other Relevant Orders   Ambulatory referral to Vascular Surgery   Varicose veins of both lower extremities with pain       Relevant Medications   hydrochlorothiazide (HYDRODIURIL) 25 MG tablet   Other Relevant Orders   Ambulatory referral to Vascular Surgery   Venous insufficiency of both lower extremities       Relevant Medications   hydrochlorothiazide (HYDRODIURIL) 25 MG tablet   Other Relevant Orders   Ambulatory referral to Vascular Surgery       Chronic bilateral LE Edema Venous insufficiency Varicose veins  Stop taking Amlodipine I think it is worsening the swelling.  Start taking Hydrochlorothiazide 25mg  daily, it will help with swelling.  Use RICE therapy: - R - Rest / relative rest with activity modification avoid overuse of joint - I - Ice packs (make sure you use a towel or sock / something to protect skin) - C - Compression with ACE wrap to apply pressure and reduce swelling allowing more support - E - Elevation - if significant swelling, lift leg above heart level (toes above your nose) to help reduce swelling, most helpful at night after day of being on your feet   -------  Referral to Vascular Surgery  [facility] [address] [place],  [place]  [address] Main: [phone]  ----  #Psoriasis Communicated with his Dermatology today, he had been having difficulty with return phone calls. They will proceed to order Tremfaya now as he has had the required blood work. He is low risk for HIV, and I discussed with them today that he has completed Hep B antibody testing. He should follow up with them as scheduled once injections arrive.  Meds ordered this  encounter  Medications   hydrochlorothiazide (HYDRODIURIL) 25 MG tablet    Sig: Take 1 tablet (25 mg total) by mouth daily.    Dispense:  30 tablet    Refill:  2  Follow up plan: Return if symptoms worsen or fail to improve.    Saralyn Pilar, DO [facility] 07/31/2022, 11:51 AM

## 2022-08-05 ENCOUNTER — Other Ambulatory Visit: Payer: Self-pay

## 2022-08-05 MED ORDER — TREMFYA 100 MG/ML ~~LOC~~ SOAJ: 100.0000 mg | SUBCUTANEOUS | 3 refills | Status: DC

## 2022-08-05 MED ORDER — TREMFYA 100 MG/ML ~~LOC~~ SOAJ: 100.0000 mg | SUBCUTANEOUS | 0 refills | Status: DC

## 2022-08-05 NOTE — Progress Notes (Signed)
Pharmacy transfer request from [facility] to [facility] speciality Pharmacy. Escripted

## 2022-08-05 NOTE — Progress Notes (Signed)
Pharmacy transfer request from [facility] to [facility] speciality [facility]. Escripted  

## 2022-09-11 ENCOUNTER — Ambulatory Visit: Payer: Managed Care, Other (non HMO) | Admitting: Dermatology

## 2022-09-11 DIAGNOSIS — Z79899 Other long term (current) drug therapy: Secondary | ICD-10-CM | POA: Diagnosis not present

## 2022-09-11 DIAGNOSIS — L409 Psoriasis, unspecified: Secondary | ICD-10-CM

## 2022-09-11 DIAGNOSIS — Z7189 Other specified counseling: Secondary | ICD-10-CM

## 2022-09-11 MED ORDER — GUSELKUMAB 100 MG/ML ~~LOC~~ SOSY
100.0000 mg | PREFILLED_SYRINGE | Freq: Once | SUBCUTANEOUS | Status: AC
  Administered 2022-09-11: 100 mg via SUBCUTANEOUS

## 2022-09-11 NOTE — Progress Notes (Signed)
   Follow-Up Visit   Subjective  Ricky Mejia is a 52 y.o. male who presents for the following: Psoriasis - Patient started [place] but got an extremely bad attitude after about a week so he stopped. We sent in Tremfya and it has been approved but has an $1100 copay. He has a copay assistance card but has not activated it yet because he is concerned about his information getting hacked through the internet. He has been using [place] black soap and it has been helping.  The following portions of the chart were reviewed this encounter and updated as appropriate: medications, allergies, medical history  Review of Systems:  No other skin or systemic complaints except as noted in HPI or Assessment and Plan.  Objective  Well appearing patient in no apparent distress; mood and affect are within normal limits. Areas Examined: Hands, arms Relevant exam findings are noted in the Assessment and Plan.   Assessment & Plan   Psoriasis  Related Medications tazarotene (TAZORAC) 0.1 % cream Apply topically at bedtime.  betamethasone dipropionate 0.05 % cream Apply topically at bedtime. Apply over tazarotene cream at bedtime to hands, feet and right knee  Roflumilast (ZORYVE) 0.3 % CREA Apply 1 application  topically daily.  Guselkumab SOSY 100 mg    PSORIASIS Well-demarcated erythematous papules/plaques with silvery scale, guttate pink scaly papules.  Chronic and persistent condition with duration or expected duration over one year. Condition is symptomatic/ bothersome to patient. Not currently at goal. Treatment Plan: Start systemic Tremfya injections  continue: tazarotene 0.1% cream qhs,  Zoryve 0.3% cream qd.  May continue [place] black soap.  Advised patient that I have talked to [facility] and his medication order is ready to process. Advised him to enroll in the copay assistance program and call [facility] with his copay card so they can process his medication and ship it out so  that we will have it for his second loading dose in 4 weeks.  Tremfya 100 mg/ml injection given to left upper arm today. NDC 47425-956-38 Lot #[id number] Exp 03/2023  Counseling on psoriasis and coordination of care  psoriasis is a chronic non-curable, but treatable genetic/hereditary disease that may have other systemic features affecting other organ systems such as joints (Psoriatic Arthritis). It is associated with an increased risk of inflammatory bowel disease, heart disease, non-alcoholic fatty liver disease, and depression.  Treatments include light and laser treatments; topical medications; and systemic medications including oral and injectables.  Return in about 4 weeks (around 10/09/2022) for Psoriasis.  I, Joanie Coddington, CMA, am acting as scribe for Armida Sans, MD .  Documentation: I have reviewed the above documentation for accuracy and completeness, and I agree with the above.  Armida Sans, MD

## 2022-09-11 NOTE — Patient Instructions (Signed)
Due to recent changes in healthcare laws, you may see results of your pathology and/or laboratory studies on MyChart before the doctors have had a chance to review them. We understand that in some cases there may be results that are confusing or concerning to you. Please understand that not all results are received at the same time and often the doctors may need to interpret multiple results in order to provide you with the best plan of care or course of treatment. Therefore, we ask that you please give us 2 business days to thoroughly review all your results before contacting the office for clarification. Should we see a critical lab result, you will be contacted sooner.   If You Need Anything After Your Visit  If you have any questions or concerns for your doctor, please call our main line at [phone] and press option 4 to reach your doctor's medical assistant. If no one answers, please leave a voicemail as directed and we will return your call as soon as possible. Messages left after 4 pm will be answered the following business day.   You may also send us a message via MyChart. We typically respond to MyChart messages within 1-2 business days.  For prescription refills, please ask your pharmacy to contact our office. Our fax number is [phone].  If you have an urgent issue when the clinic is closed that cannot wait until the next business day, you can page your doctor at the number below.    Please note that while we do our best to be available for urgent issues outside of office hours, we are not available 24/7.   If you have an urgent issue and are unable to reach us, you may choose to seek medical care at your doctor's office, retail clinic, urgent care center, or emergency room.  If you have a medical emergency, please immediately call 911 or go to the emergency department.  Pager Numbers  - Dr. Kowalski: [phone]  - Dr. Moye: [phone]  - Dr. Stewart:  [phone]  In the event of inclement weather, please call our main line at [phone] for an update on the status of any delays or closures.  Dermatology Medication Tips: Please keep the boxes that topical medications come in in order to help keep track of the instructions about where and how to use these. Pharmacies typically print the medication instructions only on the boxes and not directly on the medication tubes.   If your medication is too expensive, please contact our office at [phone] option 4 or send us a message through MyChart.   We are unable to tell what your co-pay for medications will be in advance as this is different depending on your insurance coverage. However, we may be able to find a substitute medication at lower cost or fill out paperwork to get insurance to cover a needed medication.   If a prior authorization is required to get your medication covered by your insurance company, please allow us 1-2 business days to complete this process.  Drug prices often vary depending on where the prescription is filled and some pharmacies may offer cheaper prices.  The website www.goodrx.com contains coupons for medications through different pharmacies. The prices here do not account for what the cost may be with help from insurance (it may be cheaper with your insurance), but the website can give you the price if you did not use any insurance.  - You can print the associated coupon and take it with   your prescription to the pharmacy.  - You may also stop by our office during regular business hours and pick up a GoodRx coupon card.  - If you need your prescription sent electronically to a different pharmacy, notify our office through [facility] MyChart or by phone at [phone] option 4.     Si Usted Necesita Algo Despus de Su Visita  Tambin puede enviarnos un mensaje a travs de MyChart. Por lo general respondemos a los mensajes de MyChart en el transcurso de 1 a 2  das hbiles.  Para renovar recetas, por favor pida a su farmacia que se ponga en contacto con nuestra oficina. Nuestro nmero de fax es el [phone].  Si tiene un asunto urgente cuando la clnica est cerrada y que no puede esperar hasta el siguiente da hbil, puede llamar/localizar a su doctor(a) al nmero que aparece a continuacin.   Por favor, tenga en cuenta que aunque hacemos todo lo posible para estar disponibles para asuntos urgentes fuera del horario de oficina, no estamos disponibles las 24 horas del da, los 7 das de la semana.   Si tiene un problema urgente y no puede comunicarse con nosotros, puede optar por buscar atencin mdica  en el consultorio de su doctor(a), en una clnica privada, en un centro de atencin urgente o en una sala de emergencias.  Si tiene una emergencia mdica, por favor llame inmediatamente al 911 o vaya a la sala de emergencias.  Nmeros de bper  - Dr. Kowalski: [phone]  - Dra. Moye: [phone]  - Dra. Stewart: [phone]  En caso de inclemencias del tiempo, por favor llame a nuestra lnea principal al [phone] para una actualizacin sobre el estado de cualquier retraso o cierre.  Consejos para la medicacin en dermatologa: Por favor, guarde las cajas en las que vienen los medicamentos de uso tpico para ayudarle a seguir las instrucciones sobre dnde y cmo usarlos. Las farmacias generalmente imprimen las instrucciones del medicamento slo en las cajas y no directamente en los tubos del medicamento.   Si su medicamento es muy caro, por favor, pngase en contacto con nuestra oficina llamando al [phone] y presione la opcin 4 o envenos un mensaje a travs de MyChart.   No podemos decirle cul ser su copago por los medicamentos por adelantado ya que esto es diferente dependiendo de la cobertura de su seguro. Sin embargo, es posible que podamos encontrar un medicamento sustituto a menor costo o llenar un formulario para que el  seguro cubra el medicamento que se considera necesario.   Si se requiere una autorizacin previa para que su compaa de seguros cubra su medicamento, por favor permtanos de 1 a 2 das hbiles para completar este proceso.  Los precios de los medicamentos varan con frecuencia dependiendo del lugar de dnde se surte la receta y alguna farmacias pueden ofrecer precios ms baratos.  El sitio web www.goodrx.com tiene cupones para medicamentos de diferentes farmacias. Los precios aqu no tienen en cuenta lo que podra costar con la ayuda del seguro (puede ser ms barato con su seguro), pero el sitio web puede darle el precio si no utiliz ningn seguro.  - Puede imprimir el cupn correspondiente y llevarlo con su receta a la farmacia.  - Tambin puede pasar por nuestra oficina durante el horario de atencin regular y recoger una tarjeta de cupones de GoodRx.  - Si necesita que su receta se enve electrnicamente a una farmacia diferente, informe a nuestra oficina a travs de MyChart de [place]   o por telfono llamando al [phone] y presione la opcin 4.  

## 2022-09-15 ENCOUNTER — Telehealth: Payer: Self-pay

## 2022-09-15 NOTE — Telephone Encounter (Signed)
Tremfya being delivered on 09/17/22.

## 2022-09-17 ENCOUNTER — Other Ambulatory Visit (INDEPENDENT_AMBULATORY_CARE_PROVIDER_SITE_OTHER): Payer: Self-pay | Admitting: Nurse Practitioner

## 2022-09-17 DIAGNOSIS — I872 Venous insufficiency (chronic) (peripheral): Secondary | ICD-10-CM

## 2022-09-17 DIAGNOSIS — R600 Localized edema: Secondary | ICD-10-CM

## 2022-09-17 DIAGNOSIS — I83813 Varicose veins of bilateral lower extremities with pain: Secondary | ICD-10-CM

## 2022-09-23 ENCOUNTER — Encounter: Payer: Self-pay | Admitting: Dermatology

## 2022-09-25 ENCOUNTER — Ambulatory Visit (INDEPENDENT_AMBULATORY_CARE_PROVIDER_SITE_OTHER): Payer: Managed Care, Other (non HMO) | Admitting: Nurse Practitioner

## 2022-09-25 ENCOUNTER — Ambulatory Visit (INDEPENDENT_AMBULATORY_CARE_PROVIDER_SITE_OTHER): Payer: Managed Care, Other (non HMO)

## 2022-09-25 ENCOUNTER — Encounter (INDEPENDENT_AMBULATORY_CARE_PROVIDER_SITE_OTHER): Payer: Self-pay | Admitting: Nurse Practitioner

## 2022-09-25 VITALS — BP 137/91 | HR 106 | Resp 18 | Ht 73.0 in | Wt 318.4 lb

## 2022-09-25 DIAGNOSIS — I83813 Varicose veins of bilateral lower extremities with pain: Secondary | ICD-10-CM

## 2022-09-25 DIAGNOSIS — I872 Venous insufficiency (chronic) (peripheral): Secondary | ICD-10-CM | POA: Diagnosis not present

## 2022-09-25 DIAGNOSIS — Z6841 Body Mass Index (BMI) 40.0 and over, adult: Secondary | ICD-10-CM

## 2022-09-25 DIAGNOSIS — I10 Essential (primary) hypertension: Secondary | ICD-10-CM

## 2022-09-25 DIAGNOSIS — R600 Localized edema: Secondary | ICD-10-CM

## 2022-09-25 DIAGNOSIS — M7989 Other specified soft tissue disorders: Secondary | ICD-10-CM | POA: Diagnosis not present

## 2022-09-25 NOTE — Progress Notes (Signed)
Subjective:    Patient ID: Ricky Mejia, male    DOB: 1971-02-17, 52 y.o.   MRN: [id number] Chief Complaint  Patient presents with   Establish Care    Deryk Wolke is a 52 year old male who presents today as a referral from Dr. Althea Charon in regards to lower extremity edema.  He notes that he has had lower extremity edema for years.  The patient stands for long periods as he works 2 full-time jobs.  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  He notes that he recently had some medication changes which has significantly improved his swelling but not completely resolved.  He was on amlodipine and he was recently stopped on this and started on hydralazine.  This has been very beneficial for him.  He notes that he has never utilize compression socks.  He also has psoriasis which is affecting him more so on the bottom of his feet currently.  He denies any claudication-like symptoms.  He denies any rest pain.  He was a former tobacco user and stopped since January.  Today noninvasive study showed no evidence of DVT or superficial thrombophlebitis bilaterally.  No evidence of deep venous insufficiency bilaterally.  There is a very small amount of superficial venous reflux in the right small saphenous vein.  No superficial venous reflux noted in the left lower extremity.    Review of Systems  Cardiovascular:  Positive for leg swelling.  Musculoskeletal:  Positive for arthralgias.  All other systems reviewed and are negative.      Objective:   Physical Exam Vitals reviewed.  HENT:     Head: Normocephalic.  Cardiovascular:     Rate and Rhythm: Normal rate.  Pulmonary:     Effort: Pulmonary effort is normal.  Musculoskeletal:     Right lower leg: 1+ Pitting Edema present.     Left lower leg: 1+ Pitting Edema present.  Skin:    General: Skin is warm and dry.  Neurological:     Mental Status: He is alert and oriented to  person, place, and time.  Psychiatric:        Mood and Affect: Mood normal.        Behavior: Behavior normal.        Thought Content: Thought content normal.        Judgment: Judgment normal.     BP (!) 137/91 (BP Location: Right Arm)   Pulse (!) 106   Resp 18   Ht 6\' 1"  (1.854 m)   Wt (!) 318 lb 6.4 oz (144.4 kg)   BMI 42.01 kg/m   Past Medical History:  Diagnosis Date   Abnormal LFTs    Allergic rhinitis    Diverticulitis    HUS (hemolytic uremic syndrome) (HCC)    Hyperplastic colon polyp    Hypertension    Obesity    Psoriasis    Serrated adenoma of colon    T.T.P. syndrome (HCC)     Social History   Socioeconomic History   Marital status: Divorced    Spouse name: Not on file   Number of children: Not on file   Years of education: Not on file   Highest education level: Not on file  Occupational History   Not on file  Tobacco Use   Smoking status: Never   Smokeless tobacco: Former    Types: Chew    Quit date: 04/14/2022  Vaping Use  Vaping Use: Never used  Substance and Sexual Activity   Alcohol use: No   Drug use: No   Sexual activity: Not on file  Other Topics Concern   Not on file  Social History Narrative   Not on file   Social Determinants of Health   Financial Resource Strain: Not on file  Food Insecurity: Not on file  Transportation Needs: Not on file  Physical Activity: Not on file  Stress: Not on file  Social Connections: Not on file  Intimate Partner Violence: Not on file    Past Surgical History:  Procedure Laterality Date   COLONOSCOPY     COLONOSCOPY WITH PROPOFOL N/A 01/07/2018   Procedure: COLONOSCOPY WITH PROPOFOL;  Surgeon: Toney Reil, MD;  Location: [facility];  Service: Gastroenterology;  Laterality: N/A;   SIGMOIDOSCOPY     WISDOM TOOTH EXTRACTION      Family History  Problem Relation Age of Onset   Lupus Mother    Lung cancer Father    Diabetes Mellitus I Father    Cancer Father    Osteoarthritis  Maternal Grandmother    Cancer Maternal Grandmother    Osteoarthritis Maternal Grandfather    Colon cancer Maternal Grandfather    Osteoarthritis Paternal Grandmother    Osteoarthritis Paternal Grandfather     Allergies  Allergen Reactions   Flonase [Fluticasone Propionate] Other (See Comments)    nosebleeds   Sulfa Antibiotics Rash       Latest Ref Rng & Units 04/23/2022    8:11 AM 04/17/2021    8:08 AM 09/13/2020   10:39 AM  CBC  WBC 3.8 - 10.8 Thousand/uL 5.5  6.5  6.7   Hemoglobin 13.2 - 17.1 g/dL 16.1  09.6  04.5   Hematocrit 38.5 - 50.0 % 45.8  44.3  45.9   Platelets 140 - 400 Thousand/uL 234  208  219       CMP     Component Value Date/Time   NA 140 04/23/2022 0811   NA 144 11/22/2018 0833   NA 140 10/01/2011 1931   K 4.5 04/23/2022 0811   K 3.9 10/01/2011 1931   CL 105 04/23/2022 0811   CL 106 10/01/2011 1931   CO2 27 04/23/2022 0811   CO2 24 10/01/2011 1931   GLUCOSE 100 (H) 04/23/2022 0811   GLUCOSE 99 10/01/2011 1931   BUN 11 04/23/2022 0811   BUN 11 11/22/2018 0833   BUN 12 10/01/2011 1931   CREATININE 0.88 04/23/2022 0811   CALCIUM 9.2 04/23/2022 0811   CALCIUM 9.4 10/01/2011 1931   PROT 6.9 04/23/2022 0811   PROT 6.7 11/22/2018 0833   PROT 7.6 10/01/2011 1931   ALBUMIN 4.4 09/13/2020 1039   ALBUMIN 4.7 11/22/2018 0833   ALBUMIN 4.1 10/01/2011 1931   AST 41 (H) 04/23/2022 0811   AST 24 10/01/2011 1931   ALT 60 (H) 04/23/2022 0811   ALT 33 10/01/2011 1931   ALKPHOS 75 09/13/2020 1039   ALKPHOS 81 10/01/2011 1931   BILITOT 0.6 04/23/2022 0811   BILITOT 0.4 11/22/2018 0833   BILITOT 0.6 10/01/2011 1931   EGFR 105 04/23/2022 0811   GFRNONAA >60 09/13/2020 1039   GFRNONAA >60 10/01/2011 1931     No results found.     Assessment & Plan:   1. Leg swelling Recommend:  I have had a long discussion with the patient regarding swelling and why it  causes symptoms.  Patient will begin wearing graduated compression on a daily basis a  prescription was given. The patient will  wear the stockings first thing in the morning and removing them in the evening. The patient is instructed specifically not to sleep in the stockings.   In addition, behavioral modification will be initiated.  This will include frequent elevation, use of over the counter pain medications and exercise such as walking.  Consideration for a lymph pump will also be made based upon the effectiveness of conservative therapy.  This would help to improve the edema control and prevent sequela such as ulcers and infections   The patient does have a very small amount of reflux in his right small saphenous vein but no significant symptoms.  We discussed that endovenous laser ablation may not yield significant improvement in edema and wound not recommend moving forward with interventional procedures for this at this time.  Patient is stressed to utilize conservative therapy as noted above.  Patient will follow-up in 3 months  2. Class 3 severe obesity due to excess calories with body mass index (BMI) of 40.0 to 44.9 in adult, unspecified whether serious comorbidity present [facility]) Discussed how obesity does affect lower extremity edema.  Patient will work to decrease weight.  3. Essential hypertension Continue antihypertensive medications as already ordered, these medications have been reviewed and there are no changes at this time.   Current Outpatient Medications on File Prior to Visit  Medication Sig Dispense Refill   cetirizine (ZYRTEC) 10 MG tablet Take 10 mg by mouth daily.     Guselkumab (TREMFYA) 100 MG/ML SOPN Inject 100 mg into the skin as directed. At week 0 & 4. 2 mL 0   Guselkumab (TREMFYA) 100 MG/ML SOPN Inject 100 mg into the skin every 8 (eight) weeks. For maintenance. 1 mL 3   hydrochlorothiazide (HYDRODIURIL) 25 MG tablet Take 1 tablet (25 mg total) by mouth daily. 30 tablet 2   Multiple Vitamin (MULTIVITAMIN) tablet Take 1 tablet by mouth daily.      No current facility-administered medications on file prior to visit.    There are no Patient Instructions on file for this visit. No follow-ups on file.   Georgiana Spinner, NP

## 2022-10-08 ENCOUNTER — Ambulatory Visit: Payer: Managed Care, Other (non HMO) | Admitting: Dermatology

## 2022-10-08 VITALS — BP 137/83 | HR 105

## 2022-10-08 DIAGNOSIS — M549 Dorsalgia, unspecified: Secondary | ICD-10-CM | POA: Diagnosis not present

## 2022-10-08 DIAGNOSIS — B360 Pityriasis versicolor: Secondary | ICD-10-CM | POA: Diagnosis not present

## 2022-10-08 DIAGNOSIS — Z7189 Other specified counseling: Secondary | ICD-10-CM | POA: Diagnosis not present

## 2022-10-08 DIAGNOSIS — L409 Psoriasis, unspecified: Secondary | ICD-10-CM | POA: Diagnosis not present

## 2022-10-08 DIAGNOSIS — Z79899 Other long term (current) drug therapy: Secondary | ICD-10-CM

## 2022-10-08 MED ORDER — GUSELKUMAB 100 MG/ML ~~LOC~~ SOAJ
100.0000 mg | Freq: Once | SUBCUTANEOUS | Status: AC
  Administered 2022-10-08: 100 mg via SUBCUTANEOUS

## 2022-10-08 MED ORDER — KETOCONAZOLE 2 % EX SHAM: MEDICATED_SHAMPOO | CUTANEOUS | 11 refills | Status: AC

## 2022-10-08 NOTE — Patient Instructions (Addendum)
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  It is generally asymptomatic. The rash is due to overgrowth of a common type of yeast present on everyone's skin and it is not contagious.  It tends to flare more in the summer due to increased sweating on trunk.  After rash is treated, the scaliness will resolve, but the discoloration will take longer to return to normal pigmentation. The periodic use of an OTC medicated soap/shampoo with zinc or selenium sulfide can be helpful to prevent yeast overgrowth and recurrence.   Start ketoconazole shampoo - use as a body wash to affected areas including rash at shoulder 2 to 3 times weekly. Lather and let sit on skin a few minutes before rinsing.    Psoriasis  Continue Tremfya injections  Continue [place] Black soap as needed    Due to recent changes in healthcare laws, you may see results of your pathology and/or laboratory studies on MyChart before the doctors have had a chance to review them. We understand that in some cases there may be results that are confusing or concerning to you. Please understand that not all results are received at the same time and often the doctors may need to interpret multiple results in order to provide you with the best plan of care or course of treatment. Therefore, we ask that you please give [place] 2 business days to thoroughly review all your results before contacting the office for clarification. Should we see a critical lab result, you will be contacted sooner.   If You Need Anything After Your Visit  If you have any questions or concerns for your doctor, please call our main line at [phone] and press option 4 to reach your doctor's medical assistant. If no one answers, please leave a voicemail as directed and we will return your call as soon as possible. Messages left after 4 pm will be answered the following business day.    You may also send [place] a message via MyChart. We typically respond to MyChart messages within 1-2 business days.  For prescription refills, please ask your pharmacy to contact our office. Our fax number is [phone].  If you have an urgent issue when the clinic is closed that cannot wait until the next business day, you can page your doctor at the number below.    Please note that while we do our best to be available for urgent issues outside of office hours, we are not available 24/7.   If you have an urgent issue and are unable to reach [place], you may choose to seek medical care at your doctor's office, retail clinic, urgent care center, or emergency room.  If you have a medical emergency, please immediately call 911 or go to the emergency department.  Pager Numbers  - Dr. Gwen Pounds: [phone]  - Dr. Neale Burly: [phone]  - Dr. Roseanne Reno: [phone]  In the event of inclement weather, please call our main line at [phone] for an update on the status of any delays or closures.  Dermatology Medication Tips: Please keep the boxes that topical medications come in in order to help keep track of the instructions about where and how to use these. Pharmacies typically print the medication instructions only on the boxes and not directly on the medication tubes.   If your medication is too expensive, please contact our office at [phone] option 4 or send [place] a message through MyChart.  MyChart.   We are unable to tell what your co-pay for medications will be in advance as this is different depending on your insurance coverage. However, we may be able to find a substitute medication at lower cost or fill out paperwork to get insurance to cover a needed medication.   If a prior authorization is required to get your medication covered by your insurance company, please allow us 1-2 business days to complete this process.  Drug prices often vary depending on where the prescription is filled and  some pharmacies may offer cheaper prices.  The website www.goodrx.com contains coupons for medications through different pharmacies. The prices here do not account for what the cost may be with help from insurance (it may be cheaper with your insurance), but the website can give you the price if you did not use any insurance.  - You can print the associated coupon and take it with your prescription to the pharmacy.  - You may also stop by our office during regular business hours and pick up a GoodRx coupon card.  - If you need your prescription sent electronically to a different pharmacy, notify our office through [facility] MyChart or by phone at [phone] option 4.     Si Usted Necesita Algo Despus de Su Visita  Tambin puede enviarnos un mensaje a travs de MyChart. Por lo general respondemos a los mensajes de MyChart en el transcurso de 1 a 2 das hbiles.  Para renovar recetas, por favor pida a su farmacia que se ponga en contacto con nuestra oficina. Nuestro nmero de fax es el [phone].  Si tiene un asunto urgente cuando la clnica est cerrada y que no puede esperar hasta el siguiente da hbil, puede llamar/localizar a su doctor(a) al nmero que aparece a continuacin.   Por favor, tenga en cuenta que aunque hacemos todo lo posible para estar disponibles para asuntos urgentes fuera del horario de oficina, no estamos disponibles las 24 horas del da, los 7 das de la semana.   Si tiene un problema urgente y no puede comunicarse con nosotros, puede optar por buscar atencin mdica  en el consultorio de su doctor(a), en una clnica privada, en un centro de atencin urgente o en una sala de emergencias.  Si tiene una emergencia mdica, por favor llame inmediatamente al 911 o vaya a la sala de emergencias.  Nmeros de bper  - Dr. Kowalski: [phone]  - Dra. Moye: [phone]  - Dra. Stewart: [phone]  En caso de inclemencias del tiempo, por favor llame a nuestra  lnea principal al [phone] para una actualizacin sobre el estado de cualquier retraso o cierre.  Consejos para la medicacin en dermatologa: Por favor, guarde las cajas en las que vienen los medicamentos de uso tpico para ayudarle a seguir las instrucciones sobre dnde y cmo usarlos. Las farmacias generalmente imprimen las instrucciones del medicamento slo en las cajas y no directamente en los tubos del medicamento.   Si su medicamento es muy caro, por favor, pngase en contacto con nuestra oficina llamando al [phone] y presione la opcin 4 o envenos un mensaje a travs de MyChart.   No podemos decirle cul ser su copago por los medicamentos por adelantado ya que esto es diferente dependiendo de la cobertura de su seguro. Sin embargo, es posible que podamos encontrar un medicamento sustituto a menor costo o llenar un formulario para que el seguro cubra el medicamento que se considera necesario.   Si se requiere una autorizacin previa   compaa de seguros cubra su medicamento, por favor permtanos de 1 a 2 das hbiles para completar este proceso.  Los precios de los medicamentos varan con frecuencia dependiendo del lugar de dnde se surte la receta y alguna farmacias pueden ofrecer precios ms baratos.  El sitio web www.goodrx.com tiene cupones para medicamentos de diferentes farmacias. Los precios aqu no tienen en cuenta lo que podra costar con la ayuda del seguro (puede ser ms barato con su seguro), pero el sitio web puede darle el precio si no utiliz ningn seguro.  - Puede imprimir el cupn correspondiente y llevarlo con su receta a la farmacia.  - Tambin puede pasar por nuestra oficina durante el horario de atencin regular y recoger una tarjeta de cupones de GoodRx.  - Si necesita que su receta se enve electrnicamente a una farmacia diferente, informe a nuestra oficina a travs de MyChart de [place] o por telfono llamando al [phone] y presione la opcin  4.  

## 2022-10-08 NOTE — Progress Notes (Signed)
Follow-Up Visit   Subjective  Ricky Mejia is a 52 y.o. male who presents for the following: Psoriasis  4 week follow up, patient reports has seen some improvement in areas at feet. Reports some pain in back over weekend is not sure if related side effects from [facility] and would like to discuss. He is currently using jamacian black soap for psoriasis flared areas.   Patient also reports a rash at right shoulder.    The following portions of the chart were reviewed this encounter and updated as appropriate: medications, allergies, medical history  Review of Systems:  No other skin or systemic complaints except as noted in HPI or Assessment and Plan.  Objective  Well appearing patient in no apparent distress; mood and affect are within normal limits.  Areas Examined: Elbows, legs, b/l arms, b/l feet, back, b/l shoulders  Relevant exam findings are noted in the Assessment and Plan.      Assessment & Plan    Back Pain Exam: clear with no redness  Treatment Plan:  Discussed not likely not associated with Tremfya, will continue to monitor symptoms   Psoriasis  Related Medications guselkumab (TREMFYA) pen 100 mg   TV (tinea versicolor)  Related Medications ketoconazole (NIZORAL) 2 % shampoo Use as body wash 2 - 3 times weekly. Apply topically to affected areas let sit for a few minutes before rinsing.  Tinea Versicolor Shoulders, back, scalp, face  Chronic and persistent condition with duration or expected duration over one year. Condition is bothersome/symptomatic for patient. Currently flared.  Start ketoconazole shampoo as body wash 2 or 3 days a week, especially in summer months. Apply topically to affected areas, lather, let sit a few minutes before rinsing.    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  It is generally asymptomatic. The rash is due to overgrowth of a  common type of yeast present on everyone's skin and it is not contagious.  It tends to flare more in the summer due to increased sweating on trunk.  After rash is treated, the scaliness will resolve, but the discoloration will take longer to return to normal pigmentation. The periodic use of an OTC medicated soap/shampoo with zinc or selenium sulfide can be helpful to prevent yeast overgrowth and recurrence.    PSORIASIS Well-demarcated erythematous papules/plaques with silvery scale, guttate pink scaly papules. .  Chronic and persistent condition with duration or expected duration over one year. Condition is symptomatic/ bothersome to patient. Not currently at goal.   Treatment Plan: Recommend to continue Tremfya injections  Continue if desired tazarotene 0.1% cream qhs,  Continue if desired Zoryve 0.3% cream qd.  Continue Jamaican black soap to affected areas.   Will recheck in 8 weeks.    Advised patient that I have talked to [facility] and his medication order is ready to process. Advised him to enroll in the copay assistance program and call [facility] with his copay card so they can process his medication and ship it out so that we will have it for his second loading dose in 4 weeks.   Tremfya 100 mg/ml injection given to Right upper arm today. NDC 52841-324-40 Lot #[id number]  Exp 12/2023   Counseling on psoriasis and coordination of care  psoriasis is a chronic non-curable, but treatable genetic/hereditary disease that may have other systemic features affecting other organ systems such as joints (Psoriatic Arthritis). It is associated with an increased  risk of inflammatory bowel disease, heart disease, non-alcoholic fatty liver disease, and depression.  Treatments include light and laser treatments; topical medications; and systemic medications including oral and injectables.     Return for 8 week psoriasis follow up.  IAsher Muir, CMA, am acting as scribe for Armida Sans,  MD.   Documentation: I have reviewed the above documentation for accuracy and completeness, and I agree with the above.  Armida Sans, MD

## 2022-10-10 ENCOUNTER — Encounter: Payer: Self-pay | Admitting: Dermatology

## 2022-10-13 ENCOUNTER — Other Ambulatory Visit: Payer: Self-pay

## 2022-10-13 DIAGNOSIS — Z125 Encounter for screening for malignant neoplasm of prostate: Secondary | ICD-10-CM

## 2022-10-13 DIAGNOSIS — E781 Pure hyperglyceridemia: Secondary | ICD-10-CM

## 2022-10-13 DIAGNOSIS — I10 Essential (primary) hypertension: Secondary | ICD-10-CM

## 2022-10-13 DIAGNOSIS — Z0000 Encounter for general adult medical examination without abnormal findings: Secondary | ICD-10-CM

## 2022-10-13 DIAGNOSIS — R7309 Other abnormal glucose: Secondary | ICD-10-CM

## 2022-10-13 DIAGNOSIS — R7303 Prediabetes: Secondary | ICD-10-CM

## 2022-10-14 ENCOUNTER — Other Ambulatory Visit: Payer: Managed Care, Other (non HMO)

## 2022-10-15 LAB — CBC WITH DIFFERENTIAL/PLATELET
Absolute Monocytes: 777 cells/uL (ref 200–950)
Basophils Absolute: 52 cells/uL (ref 0–200)
Basophils Relative: 0.7 %
Eosinophils Absolute: 133 cells/uL (ref 15–500)
Eosinophils Relative: 1.8 %
HCT: 47.1 % (ref 38.5–50.0)
Hemoglobin: 16.2 g/dL (ref 13.2–17.1)
Lymphs Abs: 1436 cells/uL (ref 850–3900)
MCH: 32.9 pg (ref 27.0–33.0)
MCHC: 34.4 g/dL (ref 32.0–36.0)
MCV: 95.7 fL (ref 80.0–100.0)
MPV: 10.8 fL (ref 7.5–12.5)
Monocytes Relative: 10.5 %
Neutro Abs: 5002 cells/uL (ref 1500–7800)
Neutrophils Relative %: 67.6 %
Platelets: 206 10*3/uL (ref 140–400)
RBC: 4.92 10*6/uL (ref 4.20–5.80)
RDW: 13.1 % (ref 11.0–15.0)
Total Lymphocyte: 19.4 %
WBC: 7.4 10*3/uL (ref 3.8–10.8)

## 2022-10-15 LAB — LIPID PANEL
Cholesterol: 194 mg/dL (ref <200)
HDL: 54 mg/dL (ref >=40)
LDL Cholesterol (Calc): 110 mg/dL (calc) — ABNORMAL HIGH
Non-HDL Cholesterol (Calc): 140 mg/dL (calc) — ABNORMAL HIGH (ref <130)
Total CHOL/HDL Ratio: 3.6 (calc) (ref <5.0)
Triglycerides: 178 mg/dL — ABNORMAL HIGH (ref <150)

## 2022-10-15 LAB — COMPREHENSIVE METABOLIC PANEL
AG Ratio: 1.4 (calc) (ref 1.0–2.5)
ALT: 101 U/L — ABNORMAL HIGH (ref 9–46)
AST: 69 U/L — ABNORMAL HIGH (ref 10–40)
Albumin: 4.1 g/dL (ref 3.6–5.1)
Alkaline phosphatase (APISO): 77 U/L (ref 36–130)
BUN: 13 mg/dL (ref 7–25)
CO2: 29 mmol/L (ref 20–32)
Calcium: 9.6 mg/dL (ref 8.6–10.3)
Chloride: 103 mmol/L (ref 98–110)
Creat: 0.97 mg/dL (ref 0.60–1.29)
Globulin: 2.9 g/dL (calc) (ref 1.9–3.7)
Glucose, Bld: 116 mg/dL — ABNORMAL HIGH (ref 65–99)
Potassium: 4.2 mmol/L (ref 3.5–5.3)
Sodium: 140 mmol/L (ref 135–146)
Total Bilirubin: 0.5 mg/dL (ref 0.2–1.2)
Total Protein: 7 g/dL (ref 6.1–8.1)

## 2022-10-15 LAB — TSH: TSH: 1.72 mIU/L (ref 0.40–4.50)

## 2022-10-15 LAB — PSA: PSA: 0.48 ng/mL (ref <=4.00)

## 2022-10-15 LAB — HEMOGLOBIN A1C
Hgb A1c MFr Bld: 6.3 % of total Hgb — ABNORMAL HIGH (ref <5.7)
Mean Plasma Glucose: 134 mg/dL
eAG (mmol/L): 7.4 mmol/L

## 2022-10-21 ENCOUNTER — Encounter: Payer: Self-pay | Admitting: Family Medicine

## 2022-10-21 ENCOUNTER — Ambulatory Visit (INDEPENDENT_AMBULATORY_CARE_PROVIDER_SITE_OTHER): Payer: Managed Care, Other (non HMO) | Admitting: Family Medicine

## 2022-10-21 VITALS — BP 110/80 | HR 128 | Temp 98.7°F | Resp 18 | Ht 73.0 in | Wt 318.0 lb

## 2022-10-21 DIAGNOSIS — I10 Essential (primary) hypertension: Secondary | ICD-10-CM

## 2022-10-21 DIAGNOSIS — E781 Pure hyperglyceridemia: Secondary | ICD-10-CM

## 2022-10-21 DIAGNOSIS — R7303 Prediabetes: Secondary | ICD-10-CM

## 2022-10-21 DIAGNOSIS — R600 Localized edema: Secondary | ICD-10-CM | POA: Diagnosis not present

## 2022-10-21 MED ORDER — HYDROCHLOROTHIAZIDE 25 MG PO TABS: 25.0000 mg | ORAL_TABLET | Freq: Every day | ORAL | 11 refills | Status: DC

## 2022-10-21 NOTE — Patient Instructions (Addendum)
Thank you for coming to the office today.  Likely the spot on Left flank with pinched nerve.  Recent Labs    10/21/21 1611 04/23/22 0811 10/14/22 0758  HGBA1C 5.8* 5.9* 6.3*   Caution with elevated blood sugar. A1c  Goal to limit excess carb starch sugars.  Refilled BP medication hydrochlorothiazide   Diet Recommendations for Preventing Diabetes   REDUCE Starchy (carb) foods include: Bread, rice, pasta, potatoes, corn, crackers, bagels, muffins, all baked goods.   FRUITS - LIMIT these HIGH sugar/carb fruits = Pineapple, Watermelon, Bananas - OKAY with these MEDIUM sugar/carb fruits = Citrus, Oranges, Grapes - PREFER these LOW sugar/carb fruits = Apples, Berries, Pears, Plums  Protein foods include: Meat, fish, poultry, eggs, dairy foods, and beans such as pinto and kidney beans (beans also provide carbohydrate).   1. Eat at least 3 meals and 1-2 snacks per day. Never go more than 4-5 hours while awake without eating.   2. Limit starchy foods to TWO per meal and ONE per snack. ONE portion of a starchy  food is equal to the following:   - ONE slice of bread (or its equivalent, such as half of a hamburger bun).   - 1/2 cup of a "scoopable" starchy food such as potatoes or rice.   - 1 OUNCE (28 grams) of starchy snacks (crackers or pretzels, look on label).   - 15 grams of carbohydrate as shown on food label.   3. Both lunch and dinner should include a protein food, a carb food, and vegetables.   - Obtain twice as many veg's as protein or carbohydrate foods for both lunch and dinner.   - Try to keep frozen veg's on hand for a quick vegetable serving.     - Fresh or frozen veg's are best.   4. Breakfast should always include protein.     Please schedule a Follow-up Appointment to: Return in about 6 months (around 04/23/2023) for 6 month follow-up PreDM A1c.  If you have any other questions or concerns, please feel free to call the office or send a message through MyChart. You  may also schedule an earlier appointment if necessary.  Additionally, you may be receiving a survey about your experience at our office within a few days to 1 week by e-mail or mail. We value your feedback.  Saralyn Pilar, DO [facility], [place]

## 2022-10-21 NOTE — Progress Notes (Unsigned)
Subjective:    Patient ID: Ricky Mejia, male    DOB: 1970-08-07, 52 y.o.   MRN: [id number]  Ricky Mejia is a 52 y.o. male presenting on 10/21/2022 for Hypertension   HPI  Elevated LFTs / Fatty Liver Hyperlipidemia, mixed Prior history with prior TG >200 and cholesterol elevated He has had dramatic improvement with his diet, limiting cholesterol, limiting red meat Still has AST ALT elevated due to cholesterol   Psoriasis Followed by Dermatology on Tremfya with improvement.    CHRONIC HTN: Improved BP Off Amlodipine Current Meds - hydrochlorothiazide 25mg   Reports good compliance, took meds today. Tolerating well, w/o complaints. Denies CP, dyspnea, HA, edema, dizziness / lightheadedness   Pre-Diabetes / Elevated A1c A1c up to 6.3, prior A1c 5.9 He is scaling back sugar and diet improved He is motivated to make change  Former Smokeless Tobacco Quit 04/14/22, no more smokeless tobacco Using gum     Additional    Meralgia paresthetica   Internal Hemorrhoids, bleeding, last seen on colonoscopy 2019. Has persistent bright red blood per rectum.  Unable to use the compression socks due to psoriasis, when it clears he can pursue the compression. Overalll psoriasis has improved, toenails doing better on Tremfya per Dermatology  L Flank low back, burning pain couldn't turn Worse if pick up anything can last for a few minutes    Health Maintenance:   Colon CA Screening - last Colonoscopy 01/07/18, 3 polyps and ulceration biopsy, done by [facility]. He is due 12/2022.   PSA 1.19,. last lab 04/2022      10/21/2022    3:38 PM 07/31/2022   11:26 AM 04/30/2022    3:33 PM  Depression screen PHQ 2/9  Decreased Interest 0 0 0  Down, Depressed, Hopeless 0 0 0  PHQ - 2 Score 0 0 0  Altered sleeping 0 0   Tired, decreased energy 0 0   Change in appetite 0 0   Feeling bad or failure about yourself  0 0   Trouble concentrating 0 0   Moving slowly or fidgety/restless 0 0    Suicidal thoughts 0 0   PHQ-9 Score 0 0   Difficult doing work/chores Not difficult at all      Past Medical History:  Diagnosis Date   Abnormal LFTs    Allergic rhinitis    Diverticulitis    HUS (hemolytic uremic syndrome) (HCC)    Hyperplastic colon polyp    Hypertension    Obesity    Psoriasis    Serrated adenoma of colon    T.T.P. syndrome (HCC)    Past Surgical History:  Procedure Laterality Date   COLONOSCOPY     COLONOSCOPY WITH PROPOFOL N/A 01/07/2018   Procedure: COLONOSCOPY WITH PROPOFOL;  Surgeon: Toney Reil, MD;  Location: [facility] ENDOSCOPY;  Service: Gastroenterology;  Laterality: N/A;   SIGMOIDOSCOPY     WISDOM TOOTH EXTRACTION     Social History   Socioeconomic History   Marital status: Divorced    Spouse name: Not on file   Number of children: Not on file   Years of education: Not on file   Highest education level: Not on file  Occupational History   Not on file  Tobacco Use   Smoking status: Never   Smokeless tobacco: Former    Types: Chew    Quit date: 04/14/2022  Vaping Use   Vaping Use: Never used  Substance and Sexual Activity   Alcohol use: No   Drug  use: No   Sexual activity: Not on file  Other Topics Concern   Not on file  Social History Narrative   Not on file   Social Determinants of Health   Financial Resource Strain: Not on file  Food Insecurity: Not on file  Transportation Needs: Not on file  Physical Activity: Not on file  Stress: Not on file  Social Connections: Not on file  Intimate Partner Violence: Not on file   Family History  Problem Relation Age of Onset   Lupus Mother    Lung cancer Father    Diabetes Mellitus I Father    Cancer Father    Osteoarthritis Maternal Grandmother    Cancer Maternal Grandmother    Osteoarthritis Maternal Grandfather    Colon cancer Maternal Grandfather    Osteoarthritis Paternal Grandmother    Osteoarthritis Paternal Grandfather    Current Outpatient Medications on File  Prior to Visit  Medication Sig   cetirizine (ZYRTEC) 10 MG tablet Take 10 mg by mouth daily.   Guselkumab (TREMFYA) 100 MG/ML SOPN Inject 100 mg into the skin as directed. At week 0 & 4.   Guselkumab (TREMFYA) 100 MG/ML SOPN Inject 100 mg into the skin every 8 (eight) weeks. For maintenance.   Multiple Vitamin (MULTIVITAMIN) tablet Take 1 tablet by mouth daily.   ketoconazole (NIZORAL) 2 % shampoo Use as body wash 2 - 3 times weekly. Apply topically to affected areas let sit for a few minutes before rinsing. (Patient not taking: Reported on 10/21/2022)   No current facility-administered medications on file prior to visit.    Review of Systems Per HPI unless specifically indicated above     Objective:    BP 110/80 (BP Location: Right Arm, Patient Position: Sitting)   Pulse (!) 128   Temp 98.7 F (37.1 C) (Oral)   Resp 18   Ht 6\' 1"  (1.854 m)   Wt (!) 318 lb (144.2 kg)   SpO2 96%   BMI 41.96 kg/m   Wt Readings from Last 3 Encounters:  10/21/22 (!) 318 lb (144.2 kg)  09/25/22 (!) 318 lb 6.4 oz (144.4 kg)  07/31/22 (!) 315 lb (142.9 kg)    Physical Exam Vitals and nursing note reviewed.  Constitutional:      General: He is not in acute distress.    Appearance: He is well-developed. He is obese. He is not diaphoretic.     Comments: Well-appearing, comfortable, cooperative  HENT:     Head: Normocephalic and atraumatic.  Eyes:     General:        Right eye: No discharge.        Left eye: No discharge.     Conjunctiva/sclera: Conjunctivae normal.     Pupils: Pupils are equal, round, and reactive to light.  Neck:     Thyroid: No thyromegaly.  Cardiovascular:     Rate and Rhythm: Regular rhythm. Tachycardia present.     Pulses: Normal pulses.     Heart sounds: Normal heart sounds. No murmur heard. Pulmonary:     Effort: Pulmonary effort is normal. No respiratory distress.     Breath sounds: Normal breath sounds. No wheezing or rales.  Abdominal:     General: Bowel sounds  are normal. There is no distension.     Palpations: Abdomen is soft. There is no mass.     Tenderness: There is no abdominal tenderness.  Musculoskeletal:        General: No tenderness. Normal range of motion.  Cervical back: Normal range of motion and neck supple.     Comments: Upper / Lower Extremities: - Normal muscle tone, strength bilateral upper extremities 5/5, lower extremities 5/5  Lymphadenopathy:     Cervical: No cervical adenopathy.  Skin:    General: Skin is warm and dry.     Findings: No erythema or rash.  Neurological:     Mental Status: He is alert and oriented to person, place, and time. Mental status is at baseline.     Comments: Distal sensation intact to light touch all extremities  Psychiatric:        Mood and Affect: Mood normal.        Behavior: Behavior normal.        Thought Content: Thought content normal.     Comments: Well groomed, good eye contact, normal speech and thoughts       Results for orders placed or performed in visit on 10/13/22  CBC with Differential/Platelet  Result Value Ref Range   WBC 7.4 3.8 - 10.8 Thousand/uL   RBC 4.92 4.20 - 5.80 Million/uL   Hemoglobin 16.2 13.2 - 17.1 g/dL   HCT 40.9 81.1 - 91.4 %   MCV 95.7 80.0 - 100.0 fL   MCH 32.9 27.0 - 33.0 pg   MCHC 34.4 32.0 - 36.0 g/dL   RDW 78.2 95.6 - 21.3 %   Platelets 206 140 - 400 Thousand/uL   MPV 10.8 7.5 - 12.5 fL   Neutro Abs 5,002 1,500 - 7,800 cells/uL   Lymphs Abs 1,436 850 - 3,900 cells/uL   Absolute Monocytes 777 200 - 950 cells/uL   Eosinophils Absolute 133 15 - 500 cells/uL   Basophils Absolute 52 0 - 200 cells/uL   Neutrophils Relative % 67.6 %   Total Lymphocyte 19.4 %   Monocytes Relative 10.5 %   Eosinophils Relative 1.8 %   Basophils Relative 0.7 %  Comprehensive metabolic panel  Result Value Ref Range   Glucose, Bld 116 (H) 65 - 99 mg/dL   BUN 13 7 - 25 mg/dL   Creat 0.86 5.78 - 4.69 mg/dL   BUN/Creatinine Ratio SEE NOTE: 6 - 22 (calc)   Sodium  140 135 - 146 mmol/L   Potassium 4.2 3.5 - 5.3 mmol/L   Chloride 103 98 - 110 mmol/L   CO2 29 20 - 32 mmol/L   Calcium 9.6 8.6 - 10.3 mg/dL   Total Protein 7.0 6.1 - 8.1 g/dL   Albumin 4.1 3.6 - 5.1 g/dL   Globulin 2.9 1.9 - 3.7 g/dL (calc)   AG Ratio 1.4 1.0 - 2.5 (calc)   Total Bilirubin 0.5 0.2 - 1.2 mg/dL   Alkaline phosphatase (APISO) 77 36 - 130 U/L   AST 69 (H) 10 - 40 U/L   ALT 101 (H) 9 - 46 U/L  Lipid panel  Result Value Ref Range   Cholesterol 194 <200 mg/dL   HDL 54 > OR = 40 mg/dL   Triglycerides 629 (H) <150 mg/dL   LDL Cholesterol (Calc) 110 (H) mg/dL (calc)   Total CHOL/HDL Ratio 3.6 <5.0 (calc)   Non-HDL Cholesterol (Calc) 140 (H) <130 mg/dL (calc)  TSH  Result Value Ref Range   TSH 1.72 0.40 - 4.50 mIU/L  PSA  Result Value Ref Range   PSA 0.48 < OR = 4.00 ng/mL  Hemoglobin A1c  Result Value Ref Range   Hgb A1c MFr Bld 6.3 (H) <5.7 % of total Hgb   Mean Plasma Glucose 134  mg/dL   eAG (mmol/L) 7.4 mmol/L      Assessment & Plan:   Problem List Items Addressed This Visit     Essential hypertension - Primary   Relevant Medications   hydrochlorothiazide (HYDRODIURIL) 25 MG tablet   Pre-diabetes   Other Visit Diagnoses     Hypertriglyceridemia       Relevant Medications   hydrochlorothiazide (HYDRODIURIL) 25 MG tablet   Bilateral lower extremity edema       Relevant Medications   hydrochlorothiazide (HYDRODIURIL) 25 MG tablet       PreDM A1c elevated to 6.3 Due to dietary / wt gain Discussion on natural management plan with lifestyle diet exercise Future reconsider wt loss meds  HYPERTENSION Controlled Refill hydrochlorothiazide 25mg  daily has worked well  Edema improved   Meds ordered this encounter  Medications   hydrochlorothiazide (HYDRODIURIL) 25 MG tablet    Sig: Take 1 tablet (25 mg total) by mouth daily.    Dispense:  30 tablet    Refill:  11      Follow up plan: Return in about 6 months (around 04/23/2023) for 6 month  follow-up PreDM A1c.  Saralyn Pilar, DO [facility] 10/21/2022, 3:44 PM

## 2022-10-28 ENCOUNTER — Other Ambulatory Visit: Payer: Self-pay | Admitting: Family Medicine

## 2022-10-28 DIAGNOSIS — R600 Localized edema: Secondary | ICD-10-CM

## 2022-10-28 DIAGNOSIS — I10 Essential (primary) hypertension: Secondary | ICD-10-CM

## 2022-10-29 NOTE — Telephone Encounter (Signed)
Unable to refill per protocol, Rx request is too soon. Last refill 10/21/22 for 30 and 11 refills. E-Prescribing Status: Receipt confirmed by pharmacy (10/21/2022  3:55 PM EDT).  Requested Prescriptions  Pending Prescriptions Disp Refills   hydrochlorothiazide (HYDRODIURIL) 25 MG tablet [Pharmacy Med Name: hydroCHLOROthiazide 25 MG TABLET] 90 tablet     Sig: TAKE 1 TABLET BY MOUTH DAILY     Cardiovascular: Diuretics - Thiazide Passed - 10/28/2022  6:23 AM      Passed - Cr in normal range and within 180 days    Creat  Date Value Ref Range Status  10/14/2022 0.97 0.60 - 1.29 mg/dL Final         Passed - K in normal range and within 180 days    Potassium  Date Value Ref Range Status  10/14/2022 4.2 3.5 - 5.3 mmol/L Final  10/01/2011 3.9 3.5 - 5.1 mmol/L Final         Passed - Na in normal range and within 180 days    Sodium  Date Value Ref Range Status  10/14/2022 140 135 - 146 mmol/L Final  11/22/2018 144 134 - 144 mmol/L Final  10/01/2011 140 136 - 145 mmol/L Final         Passed - Last BP in normal range    BP Readings from Last 1 Encounters:  10/21/22 110/80         Passed - Valid encounter within last 6 months    Recent Outpatient Visits           1 week ago Essential hypertension   [place] [facility] Smitty Cords, DO   3 months ago Bilateral lower extremity edema   [place] [facility] Smitty Cords, DO   6 months ago Annual physical exam   [place] [facility] Smitty Cords, DO   1 year ago Pre-diabetes   [facility] Smitty Cords, DO   1 year ago Meralgia paresthetica of both lower extremities   [place] [facility] Smitty Cords, DO       Future Appointments             In 1 month Gwen Pounds Dineen Kid, MD [facility]   In 5 months Althea Charon, Netta Neat, DO [facility]

## 2022-12-09 ENCOUNTER — Ambulatory Visit (INDEPENDENT_AMBULATORY_CARE_PROVIDER_SITE_OTHER): Payer: Managed Care, Other (non HMO) | Admitting: Dermatology

## 2022-12-09 ENCOUNTER — Encounter: Payer: Self-pay | Admitting: Dermatology

## 2022-12-09 DIAGNOSIS — Z79899 Other long term (current) drug therapy: Secondary | ICD-10-CM | POA: Diagnosis not present

## 2022-12-09 DIAGNOSIS — Z7189 Other specified counseling: Secondary | ICD-10-CM

## 2022-12-09 DIAGNOSIS — L409 Psoriasis, unspecified: Secondary | ICD-10-CM | POA: Diagnosis not present

## 2022-12-09 NOTE — Progress Notes (Signed)
Follow-Up Visit   Subjective  Ricky Mejia is a 52 y.o. male who presents for the following: Psoriasis. 2 month follow up. States cannot taste anymore. States throat was feeling "clogged up" for a few days 2-3 days ago. Has had two Tremfya injections. Patient states he is here for another injection. He states we are supposed to have his medication. We do not have any samples to give today.   Denies Covid or any illness.  Tremfya started 09/11/2022.    The following portions of the chart were reviewed this encounter and updated as appropriate: medications, allergies, medical history  Review of Systems:  No other skin or systemic complaints except as noted in HPI or Assessment and Plan.  Objective  Well appearing patient in no apparent distress; mood and affect are within normal limits.  Areas Examined: Arms, legs, back, shoulders  Relevant exam findings are noted in the Assessment and Plan.                 Assessment & Plan     PSORIASIS Well-demarcated erythematous papules/plaques with silvery scale, guttate pink scaly papules. 5% BSA.  Chronic and persistent condition with duration or expected duration over one year. Condition is improving with treatment but not currently at goal.   Counseling and coordination of care for severe psoriasis on systemic treatment  Psoriasis - severe on systemic treatment.  Psoriasis is a chronic non-curable, but treatable genetic/hereditary disease that may have other systemic features affecting other organ systems such as joints (Psoriatic Arthritis).  It is linked with heart disease, inflammatory bowel disease, non-alcoholic fatty liver disease, and depression. Significant skin psoriasis and/or psoriatic arthritis may have significant symptoms and affects activities of daily activity and often benefits from systemic treatments.  These systemic treatments have some potential side effects including immunosuppression and require  pre-treatment laboratory screening and periodic laboratory monitoring and periodic in person evaluation and monitoring by the attending dermatologist physician (long term medication management).   Patient admits joint pain since starting Tremfya  Treatment Plan: Recommend to continue Tremfya injections   Continue if desired tazarotene 0.1% cream qhs,  Continue if desired Zoryve 0.3% cream qd.  Continue Jamaican black soap to affected areas.     We do not have any samples of Tremfya. Gave patient contact information for [facility] so he can call them and get his delivery scheduled. He will have it sent to our office and will come here every 8 weeks for injection.  He will call when he receives Tremfya and schedule a nurse visit for injection only.   Reviewed risks of biologics including immunosuppression, infections, injection site reaction, and failure to improve condition. Goal is control of skin condition, not cure.  Some older biologics such as Humira and Enbrel may slightly increase risk of malignancy and may worsen congestive heart failure.  Taltz and Cosentyx may cause inflammatory bowel disease to flare. The use of biologics requires long term medication management, including periodic office visits and monitoring of blood work.   Long term medication management.  Patient is using long term (months to years) prescription medication  to control their dermatologic condition.  These medications require periodic monitoring to evaluate for efficacy and side effects and may require periodic laboratory monitoring.   Return in about 5 months (around 05/11/2023) for Psoriasis Follow Up.  I, Lawson Radar, CMA, am acting as scribe for Armida Sans, MD.   Documentation: I have reviewed the above documentation for accuracy and completeness, and I  agree with the above.  Armida Sans, MD

## 2022-12-09 NOTE — Patient Instructions (Addendum)
Recommend to continue Tremfya injections every 8 weeks.    Continue if desired tazarotene 0.1% cream qhs,  Continue if desired Zoryve 0.3% cream qd.  Continue Jamaican black soap to affected areas.     Reviewed risks of biologics including immunosuppression, infections, injection site reaction, and failure to improve condition. Goal is control of skin condition, not cure.  Some older biologics such as Humira and Enbrel may slightly increase risk of malignancy and may worsen congestive heart failure.  Taltz and Cosentyx may cause inflammatory bowel disease to flare. The use of biologics requires long term medication management, including periodic office visits and monitoring of blood work.     Due to recent changes in healthcare laws, you may see results of your pathology and/or laboratory studies on MyChart before the doctors have had a chance to review them. We understand that in some cases there may be results that are confusing or concerning to you. Please understand that not all results are received at the same time and often the doctors may need to interpret multiple results in order to provide you with the best plan of care or course of treatment. Therefore, we ask that you please give [place] 2 business days to thoroughly review all your results before contacting the office for clarification. Should we see a critical lab result, you will be contacted sooner.   If You Need Anything After Your Visit  If you have any questions or concerns for your doctor, please call our main line at [phone] and press option 4 to reach your doctor's medical assistant. If no one answers, please leave a voicemail as directed and we will return your call as soon as possible. Messages left after 4 pm will be answered the following business day.   You may also send [place] a message via MyChart. We typically respond to MyChart messages within 1-2 business days.  For prescription refills, please ask your pharmacy to  contact our office. Our fax number is [phone].  If you have an urgent issue when the clinic is closed that cannot wait until the next business day, you can page your doctor at the number below.    Please note that while we do our best to be available for urgent issues outside of office hours, we are not available 24/7.   If you have an urgent issue and are unable to reach [place], you may choose to seek medical care at your doctor's office, retail clinic, urgent care center, or emergency room.  If you have a medical emergency, please immediately call 911 or go to the emergency department.  Pager Numbers  - Dr. Gwen Pounds: [phone]  - Dr. Roseanne Reno: [phone]  - Dr. Katrinka Blazing: [phone]   In the event of inclement weather, please call our main line at [phone] for an update on the status of any delays or closures.  Dermatology Medication Tips: Please keep the boxes that topical medications come in in order to help keep track of the instructions about where and how to use these. Pharmacies typically print the medication instructions only on the boxes and not directly on the medication tubes.   If your medication is too expensive, please contact our office at [phone] option 4 or send [place] a message through MyChart.   We are unable to tell what your co-pay for medications will be in advance as this is different depending on your insurance coverage. However, we may be able to find a substitute medication at lower cost or fill out  paperwork to get insurance to cover a needed medication.   If a prior authorization is required to get your medication covered by your insurance company, please allow [place] 1-2 business days to complete this process.  Drug prices often vary depending on where the prescription is filled and some pharmacies may offer cheaper prices.  The website www.goodrx.com contains coupons for medications through different pharmacies. The prices here do not account for what  the cost may be with help from insurance (it may be cheaper with your insurance), but the website can give you the price if you did not use any insurance.  - You can print the associated coupon and take it with your prescription to the pharmacy.  - You may also stop by our office during regular business hours and pick up a GoodRx coupon card.  - If you need your prescription sent electronically to a different pharmacy, notify our office through [facility] or by phone at [phone] option 4.     Si Usted Necesita Algo Despus de Su Visita  Tambin puede enviarnos un mensaje a travs de Clinical cytogeneticist. Por lo general respondemos a los mensajes de MyChart en el transcurso de 1 a 2 das hbiles.  Para renovar recetas, por favor pida a su farmacia que se ponga en contacto con nuestra oficina. Annie Sable de fax es [place] [phone].  Si tiene un asunto urgente cuando la clnica est cerrada y que no puede esperar hasta el siguiente da hbil, puede llamar/localizar a su doctor(a) al nmero que aparece a continuacin.   Por favor, tenga en cuenta que aunque hacemos todo lo posible para estar disponibles para asuntos urgentes fuera del horario de [place], no estamos disponibles las 24 horas del da, los 7 [address]  [address] de la [place].   Si tiene un problema urgente y no puede comunicarse con nosotros, puede optar por buscar atencin mdica  en el consultorio de su doctor(a), en una clnica privada, en un centro de atencin urgente o en una sala de emergencias.  Si tiene Engineer, drilling, por favor llame inmediatamente al 911 o vaya a la sala de emergencias.  Nmeros de bper  - Dr. Gwen Pounds: [phone]  - Dra. Roseanne Reno: [phone]  - Dr. Katrinka Blazing: [phone]   En caso de inclemencias del tiempo, por favor llame a Lacy Duverney principal al [phone] para una actualizacin sobre el [place] de cualquier retraso o cierre.  Consejos para la medicacin en dermatologa: Por favor, guarde las  cajas en las que vienen los medicamentos de uso tpico para ayudarle a seguir las instrucciones sobre dnde y cmo usarlos. Las farmacias generalmente imprimen las instrucciones del medicamento slo en las cajas y no directamente en los tubos del [place].   Si su medicamento es muy caro, por favor, pngase en contacto con Rolm Gala llamando al [phone] y presione la opcin 4 o envenos un mensaje a travs de Clinical cytogeneticist.   No podemos decirle cul ser su copago por los medicamentos por adelantado ya que esto es diferente dependiendo de la cobertura de su seguro. Sin embargo, es posible que podamos encontrar un medicamento sustituto a Audiological scientist un formulario para que el seguro cubra el medicamento que se considera necesario.   Si se requiere una autorizacin previa para que su compaa de seguros [place] su medicamento, por favor permtanos de 1 a 2 das hbiles para completar [address]  Los precios de los medicamentos varan con frecuencia dependiendo del Environmental consultant de dnde se surte la receta y  alguna farmacias pueden ofrecer precios ms baratos.  El sitio web www.goodrx.com tiene cupones para medicamentos de Health and safety inspector. Los precios aqu no tienen en cuenta lo que podra costar con la ayuda del seguro (puede ser ms barato con su seguro), pero el sitio web puede darle el precio si no utiliz Tourist information centre manager.  - Puede imprimir el cupn correspondiente y llevarlo con su receta a la farmacia.  - Tambin puede pasar por nuestra oficina durante el horario de atencin regular y Education officer, museum una tarjeta de cupones de GoodRx.  - Si necesita que su receta se enve electrnicamente a una farmacia diferente, informe a nuestra oficina a travs de MyChart de [place] o por telfono llamando al [phone] y presione la opcin 4.

## 2022-12-11 ENCOUNTER — Telehealth: Payer: Self-pay

## 2022-12-11 NOTE — Telephone Encounter (Signed)
Tremfya received today. Patient is suppose to call the office but I attempted to reach out to the patient. No answer and voicemail box full. aw

## 2022-12-16 ENCOUNTER — Telehealth: Payer: Self-pay

## 2022-12-16 ENCOUNTER — Ambulatory Visit: Payer: Managed Care, Other (non HMO)

## 2022-12-16 DIAGNOSIS — L409 Psoriasis, unspecified: Secondary | ICD-10-CM | POA: Diagnosis not present

## 2022-12-16 MED ORDER — GUSELKUMAB 100 MG/ML ~~LOC~~ SOAJ
100.0000 mg | SUBCUTANEOUS | Status: AC
  Administered 2022-12-16 – 2023-04-20 (×3): 100 mg via SUBCUTANEOUS

## 2022-12-16 NOTE — Addendum Note (Signed)
Addended by: Lawson Radar on: 12/16/2022 05:07 PM   Modules accepted: Orders

## 2022-12-16 NOTE — Telephone Encounter (Signed)
Can orders please be added under office visit note last week to continue Tremfya nurse injections every 8 weeks?

## 2022-12-16 NOTE — Telephone Encounter (Signed)
Done

## 2022-12-17 NOTE — Progress Notes (Signed)
Patient here for Tremfya injection for Psoriasis Vulgaris.   Tremfya 100mg /mL pen injected into left upper arm. Patient tolerated well.  LOT: [id number] EXP: 03/2024  Dorathy Daft, RMA

## 2022-12-26 ENCOUNTER — Encounter (INDEPENDENT_AMBULATORY_CARE_PROVIDER_SITE_OTHER): Payer: Self-pay | Admitting: Nurse Practitioner

## 2022-12-26 ENCOUNTER — Ambulatory Visit (INDEPENDENT_AMBULATORY_CARE_PROVIDER_SITE_OTHER): Payer: Managed Care, Other (non HMO) | Admitting: Nurse Practitioner

## 2022-12-26 VITALS — BP 132/89 | HR 97 | Resp 18 | Ht 73.0 in | Wt 316.0 lb

## 2022-12-26 DIAGNOSIS — Z6841 Body Mass Index (BMI) 40.0 and over, adult: Secondary | ICD-10-CM

## 2022-12-26 DIAGNOSIS — I10 Essential (primary) hypertension: Secondary | ICD-10-CM | POA: Diagnosis not present

## 2022-12-26 DIAGNOSIS — R600 Localized edema: Secondary | ICD-10-CM

## 2022-12-29 NOTE — Progress Notes (Signed)
Subjective:    Patient ID: Ricky Mejia, male    DOB: 20-Apr-1970, 52 y.o.   MRN: [id number] Chief Complaint  Patient presents with   Follow-up    3 months no studies    Ricky Mejia is a 52 year old male who presents today as a referral from Dr. Althea Charon in regards to lower extremity edema.  He notes that he has had lower extremity edema for years.  The patient stands for long periods as he works 2 full-time jobs.  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  He notes that he recently had some medication changes which has significantly improved his swelling but not completely resolved.  He was last seen and was recommended that he utilize medical grade compression stockings in addition to use of elevation.  However the patient notes that he had a flare with the psoriasis and was unable to use any medical grade compression.  He still continues to endorse having swelling but no development of any open wounds or ulcerations.    Review of Systems  Cardiovascular:  Positive for leg swelling.  Skin:  Positive for rash.  All other systems reviewed and are negative.      Objective:   Physical Exam Vitals reviewed.  HENT:     Head: Normocephalic.  Cardiovascular:     Rate and Rhythm: Normal rate.     Pulses: Normal pulses.  Pulmonary:     Effort: Pulmonary effort is normal.  Musculoskeletal:     Right lower leg: Edema present.     Left lower leg: Edema present.  Skin:    General: Skin is warm and dry.  Neurological:     Mental Status: He is alert and oriented to person, place, and time.  Psychiatric:        Mood and Affect: Mood normal.        Behavior: Behavior normal.        Thought Content: Thought content normal.        Judgment: Judgment normal.     BP 132/89 (BP Location: Left Arm)   Pulse 97   Resp 18   Ht 6\' 1"  (1.854 m)   Wt (!) 316 lb (143.3 kg)   BMI 41.69 kg/m   Past Medical History:   Diagnosis Date   Abnormal LFTs    Allergic rhinitis    Diverticulitis    HUS (hemolytic uremic syndrome) (HCC)    Hyperplastic colon polyp    Hypertension    Obesity    Psoriasis    Serrated adenoma of colon    T.T.P. syndrome (HCC)     Social History   Socioeconomic History   Marital status: Divorced    Spouse name: Not on file   Number of children: Not on file   Years of education: Not on file   Highest education level: Not on file  Occupational History   Not on file  Tobacco Use   Smoking status: Never   Smokeless tobacco: Former    Types: Chew    Quit date: 04/14/2022  Vaping Use   Vaping status: Never Used  Substance and Sexual Activity   Alcohol use: No   Drug use: No   Sexual activity: Not on file  Other Topics Concern   Not on file  Social History Narrative   Not on file   Social Determinants of Health   Financial Resource Strain: Not on file  Food Insecurity: Not on file  Transportation Needs: Not on file  Physical Activity: Not on file  Stress: Not on file  Social Connections: Not on file  Intimate Partner Violence: Not on file    Past Surgical History:  Procedure Laterality Date   COLONOSCOPY     COLONOSCOPY WITH PROPOFOL N/A 01/07/2018   Procedure: COLONOSCOPY WITH PROPOFOL;  Surgeon: Toney Reil, MD;  Location: [facility] ENDOSCOPY;  Service: Gastroenterology;  Laterality: N/A;   SIGMOIDOSCOPY     WISDOM TOOTH EXTRACTION      Family History  Problem Relation Age of Onset   Lupus Mother    Lung cancer Father    Diabetes Mellitus I Father    Cancer Father    Osteoarthritis Maternal Grandmother    Cancer Maternal Grandmother    Osteoarthritis Maternal Grandfather    Colon cancer Maternal Grandfather    Osteoarthritis Paternal Grandmother    Osteoarthritis Paternal Grandfather     Allergies  Allergen Reactions   Flonase [Fluticasone Propionate] Other (See Comments)    nosebleeds   Sulfa Antibiotics Rash       Latest Ref Rng &  Units 10/14/2022    7:58 AM 04/23/2022    8:11 AM 04/17/2021    8:08 AM  CBC  WBC 3.8 - 10.8 Thousand/uL 7.4  5.5  6.5   Hemoglobin 13.2 - 17.1 g/dL 32.4  40.1  02.7   Hematocrit 38.5 - 50.0 % 47.1  45.8  44.3   Platelets 140 - 400 Thousand/uL 206  234  208       CMP     Component Value Date/Time   NA 140 10/14/2022 0758   NA 144 11/22/2018 0833   NA 140 10/01/2011 1931   K 4.2 10/14/2022 0758   K 3.9 10/01/2011 1931   CL 103 10/14/2022 0758   CL 106 10/01/2011 1931   CO2 29 10/14/2022 0758   CO2 24 10/01/2011 1931   GLUCOSE 116 (H) 10/14/2022 0758   GLUCOSE 99 10/01/2011 1931   BUN 13 10/14/2022 0758   BUN 11 11/22/2018 0833   BUN 12 10/01/2011 1931   CREATININE 0.97 10/14/2022 0758   CALCIUM 9.6 10/14/2022 0758   CALCIUM 9.4 10/01/2011 1931   PROT 7.0 10/14/2022 0758   PROT 6.7 11/22/2018 0833   PROT 7.6 10/01/2011 1931   ALBUMIN 4.4 09/13/2020 1039   ALBUMIN 4.7 11/22/2018 0833   ALBUMIN 4.1 10/01/2011 1931   AST 69 (H) 10/14/2022 0758   AST 24 10/01/2011 1931   ALT 101 (H) 10/14/2022 0758   ALT 33 10/01/2011 1931   ALKPHOS 75 09/13/2020 1039   ALKPHOS 81 10/01/2011 1931   BILITOT 0.5 10/14/2022 0758   BILITOT 0.4 11/22/2018 0833   BILITOT 0.6 10/01/2011 1931   EGFR 105 04/23/2022 0811   GFRNONAA >60 09/13/2020 1039   GFRNONAA >60 10/01/2011 1931     No results found.     Assessment & Plan:   1. Bilateral edema of lower extremity The patient has had issues with his psoriasis but that is resolved hopefully will be able to engage in some conservative therapy treatments.  I have recommended that the patient continue with trying to utilize medical grade compression stockings as this will be the greatest benefit to his lower extremity edema.  He should also elevate his lower extremities when possible.  Given the patient's occupation as he does frequently walk on a regular basis but again use of compression was reiterated with him.  2.  Essential  hypertension Continue antihypertensive medications as already ordered, these medications have been reviewed and there are no changes at this time.  3. Class 3 severe obesity due to excess calories with body mass index (BMI) of 40.0 to 44.9 in adult, unspecified whether serious comorbidity present [facility]) This can also contribute to some of his lower extremity swelling   Current Outpatient Medications on File Prior to Visit  Medication Sig Dispense Refill   cetirizine (ZYRTEC) 10 MG tablet Take 10 mg by mouth daily.     Guselkumab (TREMFYA) 100 MG/ML SOPN Inject 100 mg into the skin as directed. At week 0 & 4. 2 mL 0   Guselkumab (TREMFYA) 100 MG/ML SOPN Inject 100 mg into the skin every 8 (eight) weeks. For maintenance. 1 mL 3   hydrochlorothiazide (HYDRODIURIL) 25 MG tablet Take 1 tablet (25 mg total) by mouth daily. 30 tablet 11   Multiple Vitamin (MULTIVITAMIN) tablet Take 1 tablet by mouth daily.     ketoconazole (NIZORAL) 2 % shampoo Use as body wash 2 - 3 times weekly. Apply topically to affected areas let sit for a few minutes before rinsing. (Patient not taking: Reported on 10/21/2022) 120 mL 11   Current Facility-Administered Medications on File Prior to Visit  Medication Dose Route Frequency Provider Last Rate Last Admin   guselkumab (TREMFYA) pen 100 mg  100 mg Subcutaneous Q8 Jacki Cones, MD   100 mg at 12/16/22 1600    There are no Patient Instructions on file for this visit. No follow-ups on file.   Georgiana Spinner, NP

## 2023-02-02 ENCOUNTER — Telehealth: Payer: Self-pay

## 2023-02-02 NOTE — Telephone Encounter (Signed)
Pharmacy calling, Patients tremfya will be delivered here Oct 23

## 2023-02-10 ENCOUNTER — Ambulatory Visit: Payer: Managed Care, Other (non HMO)

## 2023-02-10 DIAGNOSIS — L400 Psoriasis vulgaris: Secondary | ICD-10-CM

## 2023-02-10 NOTE — Progress Notes (Signed)
Patient here for Tremfya injection for Psoriasis Vulgaris.    Tremfya 100mg /mL pen injected into right upper arm. Patient tolerated well.   LOT: [id number] EXP:  05/2024   Dorathy Daft, RMA

## 2023-03-29 DIAGNOSIS — I890 Lymphedema, not elsewhere classified: Secondary | ICD-10-CM | POA: Insufficient documentation

## 2023-03-29 NOTE — Progress Notes (Signed)
MRN : [id number]  Ricky Mejia is a 52 y.o. (07/09/1970) male who presents with chief complaint of legs swell.  History of Present Illness:   The patient returns to the office for followup evaluation regarding leg swelling.  The swelling has persisted and the pain associated with swelling continues. There have not been any interval development of a ulcerations or wounds.  Since the previous visit the patient has been wearing graduated compression stockings and has noted little if any improvement in the lymphedema. The patient has been using compression routinely morning until night.  The patient also states elevation during the day and exercise is being done too.  No outpatient medications have been marked as taking for the 03/30/23 encounter (Appointment) with Gilda Crease, Latina Craver, MD.   Current Facility-Administered Medications for the 03/30/23 encounter (Appointment) with Gilda Crease, Latina Craver, MD  Medication   guselkumab (TREMFYA) pen 100 mg    Past Medical History:  Diagnosis Date   Abnormal LFTs    Allergic rhinitis    Diverticulitis    HUS (hemolytic uremic syndrome) (HCC)    Hyperplastic colon polyp    Hypertension    Obesity    Psoriasis    Serrated adenoma of colon    T.T.P. syndrome (HCC)     Past Surgical History:  Procedure Laterality Date   COLONOSCOPY     COLONOSCOPY WITH PROPOFOL N/A 01/07/2018   Procedure: COLONOSCOPY WITH PROPOFOL;  Surgeon: Toney Reil, MD;  Location: [facility] ENDOSCOPY;  Service: Gastroenterology;  Laterality: N/A;   SIGMOIDOSCOPY     WISDOM TOOTH EXTRACTION      Social History Social History   Tobacco Use   Smoking status: Never   Smokeless tobacco: Former    Types: Chew    Quit date: 04/14/2022  Vaping Use   Vaping status: Never Used  Substance Use Topics   Alcohol use: No   Drug use: No    Family History Family History  Problem Relation Age of Onset   Lupus Mother    Lung cancer  Father    Diabetes Mellitus I Father    Cancer Father    Osteoarthritis Maternal Grandmother    Cancer Maternal Grandmother    Osteoarthritis Maternal Grandfather    Colon cancer Maternal Grandfather    Osteoarthritis Paternal Grandmother    Osteoarthritis Paternal Grandfather     Allergies  Allergen Reactions   Flonase [Fluticasone Propionate] Other (See Comments)    nosebleeds   Sulfa Antibiotics Rash     REVIEW OF SYSTEMS (Negative unless checked)  Constitutional: [] Weight loss  [] Fever  [] Chills Cardiac: [] Chest pain   [] Chest pressure   [] Palpitations   [] Shortness of breath when laying flat   [] Shortness of breath with exertion. Vascular:  [] Pain in legs with walking   [x] Pain in legs with standing  [] History of DVT   [] Phlebitis   [x] Swelling in legs   [] Varicose veins   [] Non-healing ulcers Pulmonary:   [] Uses home oxygen   [] Productive cough   [] Hemoptysis   [] Wheeze  [] COPD   [] Asthma Neurologic:  [] Dizziness   [] Seizures   [] History of stroke   [] History of TIA  [] Aphasia   [] Vissual changes   [] Weakness or numbness in arm   [] Weakness or numbness in leg Musculoskeletal:   [] Joint swelling   [] Joint pain   []   Low back pain Hematologic:  [] Easy bruising  [] Easy bleeding   [] Hypercoagulable state   [] Anemic Gastrointestinal:  [] Diarrhea   [] Vomiting  [] Gastroesophageal reflux/heartburn   [] Difficulty swallowing. Genitourinary:  [] Chronic kidney disease   [] Difficult urination  [] Frequent urination   [] Blood in urine Skin:  [] Rashes   [] Ulcers  Psychological:  [] History of anxiety   []  History of major depression.  Physical Examination  There were no vitals filed for this visit. There is no height or weight on file to calculate BMI. Gen: WD/WN, NAD Head: [place]/AT, No temporalis wasting.  Ear/Nose/Throat: Hearing grossly intact, nares w/o erythema or drainage, pinna without lesions Eyes: PER, EOMI, sclera nonicteric.  Neck: Supple, no gross masses.  No JVD.  Pulmonary:   Good air movement, no audible wheezing, no use of accessory muscles.  Cardiac: RRR, precordium not hyperdynamic. Vascular:  scattered varicosities present bilaterally.  Mild venous stasis changes to the legs bilaterally.  3-4+ soft pitting edema, CEAP C4sEpAsPr  Vessel Right Left  Radial Palpable Palpable  Gastrointestinal: soft, non-distended. No guarding/no peritoneal signs.  Musculoskeletal: M/S 5/5 throughout.  No deformity.  Neurologic: CN 2-12 intact. Pain and light touch intact in extremities.  Symmetrical.  Speech is fluent. Motor exam as listed above. Psychiatric: Judgment intact, Mood & affect appropriate for pt's clinical situation. Dermatologic: Venous rashes no ulcers noted.  No changes consistent with cellulitis. Lymph : No lichenification or skin changes of chronic lymphedema.  CBC Lab Results  Component Value Date   WBC 7.4 10/14/2022   HGB 16.2 10/14/2022   HCT 47.1 10/14/2022   MCV 95.7 10/14/2022   PLT 206 10/14/2022    BMET    Component Value Date/Time   NA 140 10/14/2022 0758   NA 144 11/22/2018 0833   NA 140 10/01/2011 1931   K 4.2 10/14/2022 0758   K 3.9 10/01/2011 1931   CL 103 10/14/2022 0758   CL 106 10/01/2011 1931   CO2 29 10/14/2022 0758   CO2 24 10/01/2011 1931   GLUCOSE 116 (H) 10/14/2022 0758   GLUCOSE 99 10/01/2011 1931   BUN 13 10/14/2022 0758   BUN 11 11/22/2018 0833   BUN 12 10/01/2011 1931   CREATININE 0.97 10/14/2022 0758   CALCIUM 9.6 10/14/2022 0758   CALCIUM 9.4 10/01/2011 1931   GFRNONAA >60 09/13/2020 1039   GFRNONAA >60 10/01/2011 1931   GFRAA 116 11/22/2018 0833   GFRAA >60 10/01/2011 1931   CrCl cannot be calculated (Patient's most recent lab result is older than the maximum 21 days allowed.).  COAG Lab Results  Component Value Date   INR 0.85 02/16/2018    Radiology No results found.   Assessment/Plan 1. Lymphedema (Primary) Recommend:  No surgery or intervention at this point in time.   The Patient is CEAP  C4sEpAsPr.  The patient has been wearing compression for more than 12 weeks with no or little benefit.  The patient has been exercising daily for more than 12 weeks. The patient has been elevating and taking OTC pain medications for more than 12 weeks.  None of these have have eliminated the pain related to the lymphedema or the discomfort regarding excessive swelling and venous congestion.    I have reviewed my discussion with the patient regarding lymphedema and why it  causes symptoms.  Patient will continue wearing graduated compression on a daily basis. The patient should put the compression on first thing in the morning and removing them in the evening. The patient should not sleep  in the compression.   In addition, behavioral modification throughout the day will be continued.  This will include frequent elevation (such as in a recliner), use of over the counter pain medications as needed and exercise such as walking.  The systemic causes for chronic edema such as liver, kidney and cardiac etiologies do not appear to have significant changed over the past year.    The patient has chronic , severe lymphedema with hyperpigmentation of the skin and has done MLD, skin care, medication, diet, exercise, elevation and compression for 4 weeks with no improvement,  I am recommending a lymphedema pump.  The patient still has stage 3 lymphedema and therefore, I believe that a lymph pump is needed to improve the control of the patient's lymphedema and improve the quality of life.  Additionally, a lymph pump is warranted because it will reduce the risk of cellulitis and ulceration in the future.  Patient should follow-up in six months   2. Essential hypertension Continue antihypertensive medications as already ordered, these medications have been reviewed and there are no changes at this time. d   Levora Dredge, MD  03/29/2023 4:53 PM

## 2023-03-30 ENCOUNTER — Ambulatory Visit (INDEPENDENT_AMBULATORY_CARE_PROVIDER_SITE_OTHER): Payer: Managed Care, Other (non HMO) | Admitting: Vascular Surgery

## 2023-03-30 ENCOUNTER — Encounter (INDEPENDENT_AMBULATORY_CARE_PROVIDER_SITE_OTHER): Payer: Self-pay | Admitting: Vascular Surgery

## 2023-03-30 VITALS — BP 137/89 | HR 98 | Resp 18 | Ht 74.0 in | Wt 319.4 lb

## 2023-03-30 DIAGNOSIS — I890 Lymphedema, not elsewhere classified: Secondary | ICD-10-CM

## 2023-03-30 DIAGNOSIS — I10 Essential (primary) hypertension: Secondary | ICD-10-CM

## 2023-04-12 ENCOUNTER — Encounter (INDEPENDENT_AMBULATORY_CARE_PROVIDER_SITE_OTHER): Payer: Self-pay | Admitting: Vascular Surgery

## 2023-04-20 ENCOUNTER — Ambulatory Visit: Payer: Managed Care, Other (non HMO)

## 2023-04-20 DIAGNOSIS — L409 Psoriasis, unspecified: Secondary | ICD-10-CM

## 2023-04-20 NOTE — Progress Notes (Signed)
 Patient here for Tremfya injection for Psoriasis Vulgaris.    Tremfya 100mg /mL pen injected into left upper arm. Patient tolerated well.   LOT: [id number] EXP:  06/2024   Dorathy Daft, RMA

## 2023-04-24 ENCOUNTER — Ambulatory Visit: Payer: Managed Care, Other (non HMO) | Admitting: Family Medicine

## 2023-05-01 ENCOUNTER — Ambulatory Visit: Payer: Managed Care, Other (non HMO) | Admitting: Family Medicine

## 2023-05-05 ENCOUNTER — Other Ambulatory Visit: Payer: Self-pay | Admitting: Family Medicine

## 2023-05-05 ENCOUNTER — Ambulatory Visit (INDEPENDENT_AMBULATORY_CARE_PROVIDER_SITE_OTHER): Payer: Managed Care, Other (non HMO) | Admitting: Family Medicine

## 2023-05-05 ENCOUNTER — Encounter: Payer: Self-pay | Admitting: Family Medicine

## 2023-05-05 VITALS — BP 130/84 | HR 92 | Ht 74.0 in | Wt 323.0 lb

## 2023-05-05 DIAGNOSIS — Z125 Encounter for screening for malignant neoplasm of prostate: Secondary | ICD-10-CM

## 2023-05-05 DIAGNOSIS — I10 Essential (primary) hypertension: Secondary | ICD-10-CM

## 2023-05-05 DIAGNOSIS — R7303 Prediabetes: Secondary | ICD-10-CM

## 2023-05-05 DIAGNOSIS — Z1211 Encounter for screening for malignant neoplasm of colon: Secondary | ICD-10-CM | POA: Diagnosis not present

## 2023-05-05 DIAGNOSIS — J0110 Acute frontal sinusitis, unspecified: Secondary | ICD-10-CM

## 2023-05-05 DIAGNOSIS — Z0000 Encounter for general adult medical examination without abnormal findings: Secondary | ICD-10-CM

## 2023-05-05 DIAGNOSIS — E781 Pure hyperglyceridemia: Secondary | ICD-10-CM

## 2023-05-05 LAB — POCT GLYCOSYLATED HEMOGLOBIN (HGB A1C): Hemoglobin A1C: 6.1 % — AB (ref 4.0–5.6)

## 2023-05-05 MED ORDER — AMOXICILLIN-POT CLAVULANATE 875-125 MG PO TABS: 1.0000 | ORAL_TABLET | Freq: Two times a day (BID) | ORAL | 0 refills | Status: DC

## 2023-05-05 NOTE — Patient Instructions (Addendum)
Thank you for coming to the office today.  Recent Labs    10/14/22 0758 05/05/23 1608  HGBA1C 6.3* 6.1*   Please discuss the Psoriasis shots with Dermatologist Also dry skin of feet likely due to swelling lymphedema.  Augmentin for sinuses.  Referral to [facility] For Colonoscopy   DUE for FASTING BLOOD WORK (no food or drink after midnight before the lab appointment, only water or coffee without cream/sugar on the morning of)  SCHEDULE "Lab Only" visit in the morning at the clinic for lab draw in 6 MONTHS   - Make sure Lab Only appointment is at about 1 week before your next appointment, so that results will be available  For Lab Results, once available within 2-3 days of blood draw, you can can log in to MyChart online to view your results and a brief explanation. Also, we can discuss results at next follow-up visit.   Please schedule a Follow-up Appointment to: Return in about 6 months (around 11/02/2023) for 6 month fasting lab > 1 week later Annual Physical.  If you have any other questions or concerns, please feel free to call the office or send a message through MyChart. You may also schedule an earlier appointment if necessary.  Additionally, you may be receiving a survey about your experience at our office within a few days to 1 week by e-mail or mail. We value your feedback.  Saralyn Pilar, DO [facility], [place]

## 2023-05-05 NOTE — Progress Notes (Signed)
Subjective:    Patient ID: Ricky Mejia, male    DOB: Mar 17, 1971, 53 y.o.   MRN: [id number]  Ricky Mejia is a 53 y.o. male presenting on 05/05/2023 for Pre-Diabetes and Psoriasis (/)   HPI  Discussed the use of AI scribe software for clinical note transcription with the patient, who gave verbal consent to proceed.  History of Present Illness    Psoriasis / Dry Skin  Lymphedema Pre-Diabetes Obesity  The patient, with a history of psoriasis and lymphedema, presents with recent onset of upper respiratory symptoms, including nasal congestion and a deep cough. He denies fever and notes that these symptoms began a few days prior to the consultation.  In addition, the patient reports persistent foot discomfort, which he describes as feeling like "cuts on the bottom of the feet." This discomfort has been severe enough to affect his ability to walk at times. He has been receiving injections for his psoriasis, but notes that the swelling in his feet, likely related to his lymphedema, has not improved. He expresses concern that the injections may be contributing to the foot discomfort, but is unsure.  The patient also mentions a recent issue with oral discomfort, describing it as a sore mouth. This was particularly bothersome around the holiday season, but he reports that it has since improved and is not currently a significant issue.  Regarding his overall health management, the patient is making efforts to improve his diet and reduce stress. He expresses some frustration with the limitations his foot discomfort places on his physical activity. He also mentions a need for a colonoscopy and a future appointment with a dermatologist.  The patient's blood glucose levels have shown a slight improvement, moving from 6.3 to 6.1, indicating progress in his prediabetic state. He expresses a desire to continue working on his health, with a particular focus on dietary changes due to his limited  ability to increase physical activity.         05/05/2023    3:40 PM 10/21/2022    3:38 PM 07/31/2022   11:26 AM  Depression screen PHQ 2/9  Decreased Interest  0 0  Down, Depressed, Hopeless 0 0 0  PHQ - 2 Score 0 0 0  Altered sleeping  0 0  Tired, decreased energy  0 0  Change in appetite  0 0  Feeling bad or failure about yourself   0 0  Trouble concentrating  0 0  Moving slowly or fidgety/restless  0 0  Suicidal thoughts  0 0  PHQ-9 Score  0 0  Difficult doing work/chores  Not difficult at all        05/05/2023    3:40 PM 10/21/2022    3:38 PM 07/31/2022   11:26 AM 03/12/2021    2:56 PM  GAD 7 : Generalized Anxiety Score  Nervous, Anxious, on Edge 0 0 0 0  Control/stop worrying 0 0 0 0  Worry too much - different things 0 0 0 0  Trouble relaxing 0 0 0 0  Restless 0 0 0 0  Easily annoyed or irritable 0 0 0 0  Afraid - awful might happen 0 0 0 0  Total GAD 7 Score 0 0 0 0  Anxiety Difficulty Not difficult at all Not difficult at all  Not difficult at all    Social History   Tobacco Use   Smoking status: Never   Smokeless tobacco: Former    Types: Chew    Quit date:  04/14/2022  Vaping Use   Vaping status: Never Used  Substance Use Topics   Alcohol use: No   Drug use: No    Review of Systems Per HPI unless specifically indicated above     Objective:    BP 130/84   Pulse 92   Ht 6\' 2"  (1.88 m)   Wt (!) 323 lb (146.5 kg)   SpO2 98%   BMI 41.47 kg/m   Wt Readings from Last 3 Encounters:  05/05/23 (!) 323 lb (146.5 kg)  03/30/23 (!) 319 lb 6.4 oz (144.9 kg)  12/26/22 (!) 316 lb (143.3 kg)    Physical Exam Vitals and nursing note reviewed.  Constitutional:      General: He is not in acute distress.    Appearance: Normal appearance. He is well-developed. He is obese. He is not diaphoretic.     Comments: Well-appearing, comfortable, cooperative  HENT:     Head: Normocephalic and atraumatic.  Eyes:     General:        Right eye: No discharge.         Left eye: No discharge.     Conjunctiva/sclera: Conjunctivae normal.  Cardiovascular:     Rate and Rhythm: Normal rate.  Pulmonary:     Effort: Pulmonary effort is normal. No respiratory distress.     Breath sounds: Normal breath sounds. No wheezing, rhonchi or rales.     Comments: Occasional cough Skin:    General: Skin is warm and dry.     Findings: No erythema or rash.  Neurological:     Mental Status: He is alert and oriented to person, place, and time.  Psychiatric:        Mood and Affect: Mood normal.        Behavior: Behavior normal.        Thought Content: Thought content normal.     Comments: Well groomed, good eye contact, normal speech and thoughts     Results for orders placed or performed in visit on 05/05/23  POCT HgB A1C   Collection Time: 05/05/23  4:08 PM  Result Value Ref Range   Hemoglobin A1C 6.1 (A) 4.0 - 5.6 %   HbA1c POC (<> result, manual entry)     HbA1c, POC (prediabetic range)     HbA1c, POC (controlled diabetic range)        Assessment & Plan:   Problem List Items Addressed This Visit     Essential hypertension   Morbid obesity (HCC)   Pre-diabetes - Primary   Relevant Orders   POCT HgB A1C (Completed)   Other Visit Diagnoses       Acute non-recurrent frontal sinusitis       Relevant Medications   amoxicillin-clavulanate (AUGMENTIN) 875-125 MG tablet     Screening for colon cancer       Relevant Orders   Ambulatory referral to Gastroenterology        Sinusitis Upper Respiratory Infection Recent onset of symptoms including cough and nasal congestion. No fever reported. Likely sinusitis. -Prescribe Augmentin.  Psoriasis Followed by [facility] Patient receiving Tremfya shots for psoriasis. Reports persistent foot swelling and discomfort, with sensation of cuts on the bottom of the feet. Dermatology follow-up scheduled within 1 week. -Continue current treatment and follow-up with dermatologist. - discuss with them  dry skin on feet as well. I advised it is likely due to swelling lymphedema.  Lymphedema Chronic foot swelling contributing to skin dryness and discomfort. Vascular surgeons involved in  care. Likely explanation for dry skin along with psoriasis -Continue current treatment and follow-up with vascular surgeons. -Consider use of moisturizing lotions such as Lubriderm, Eucerin, Aveeno.  Prediabetes Recent HbA1c of 6.1, down from 6.3. Patient making dietary changes and attempting to increase physical activity. -Continue lifestyle modifications to lower HbA1c below 6.  Colonoscopy Due for colonoscopy. Last colonoscopy was in 2019. -Refer to GI for colonoscopy scheduling, now 5 yrs.  Oral Discomfort Reports intermittent oral discomfort, currently resolved. No clear cause identified. -Consider over-the-counter mouth ulcer wash if symptoms recur. Consider magic mouthwash, but problem resolved at this time.  General Health Maintenance / Followup Plans -Schedule annual physical in July 2025. -Check HbA1c at next visit.         Orders Placed This Encounter  Procedures   Ambulatory referral to Gastroenterology    Referral Priority:   Routine    Referral Type:   Consultation    Referral Reason:   Specialty Services Required    Number of Visits Requested:   1   POCT HgB A1C    Meds ordered this encounter  Medications   amoxicillin-clavulanate (AUGMENTIN) 875-125 MG tablet    Sig: Take 1 tablet by mouth 2 (two) times daily.    Dispense:  20 tablet    Refill:  0    Follow up plan: Return in about 6 months (around 11/02/2023) for 6 month fasting lab > 1 week later Annual Physical.  Future labs ordered for 10/2023    Saralyn Pilar, DO [facility] 05/05/2023, 3:55 PM

## 2023-05-06 ENCOUNTER — Other Ambulatory Visit: Payer: Self-pay | Admitting: Family Medicine

## 2023-05-06 DIAGNOSIS — I10 Essential (primary) hypertension: Secondary | ICD-10-CM

## 2023-05-06 NOTE — Telephone Encounter (Signed)
  The original prescription was discontinued on 07/31/2022 by Smitty Cords, DO for the following reason: Side effect (s)   Requested Prescriptions  Pending Prescriptions Disp Refills   amLODipine (NORVASC) 10 MG tablet [Pharmacy Med Name: amLODIPine BESYLATE 10MG  TAB] 90 tablet 3    Sig: TAKE 1 TABLET BY MOUTH DAILY     Cardiovascular: Calcium Channel Blockers 2 Passed - 05/06/2023  3:31 PM      Passed - Last BP in normal range    BP Readings from Last 1 Encounters:  05/05/23 130/84         Passed - Last Heart Rate in normal range    Pulse Readings from Last 1 Encounters:  05/05/23 92         Passed - Valid encounter within last 6 months    Recent Outpatient Visits           Yesterday Pre-diabetes   [place] [facility] [place], Netta Neat, DO   6 months ago Essential hypertension   [facility] Smitty Cords, DO   9 months ago Bilateral lower extremity edema   [place] [facility] Smitty Cords, DO   1 year ago Annual physical exam   [place] [facility] Smitty Cords, DO   1 year ago Pre-diabetes   [facility] Smitty Cords, DO       Future Appointments             In 1 month Elie Goody, MD [facility]   In 6 months Althea Charon, Netta Neat, DO [place] [facility]

## 2023-05-13 ENCOUNTER — Ambulatory Visit: Payer: Managed Care, Other (non HMO) | Admitting: Dermatology

## 2023-05-18 ENCOUNTER — Encounter: Payer: Self-pay | Admitting: *Deleted

## 2023-06-15 ENCOUNTER — Ambulatory Visit: Payer: Managed Care, Other (non HMO)

## 2023-06-15 ENCOUNTER — Ambulatory Visit (INDEPENDENT_AMBULATORY_CARE_PROVIDER_SITE_OTHER): Payer: Managed Care, Other (non HMO) | Admitting: Dermatology

## 2023-06-15 ENCOUNTER — Encounter: Payer: Self-pay | Admitting: Dermatology

## 2023-06-15 DIAGNOSIS — Z79899 Other long term (current) drug therapy: Secondary | ICD-10-CM | POA: Diagnosis not present

## 2023-06-15 DIAGNOSIS — L409 Psoriasis, unspecified: Secondary | ICD-10-CM | POA: Diagnosis not present

## 2023-06-15 DIAGNOSIS — Z7189 Other specified counseling: Secondary | ICD-10-CM

## 2023-06-15 MED ORDER — GUSELKUMAB 100 MG/ML ~~LOC~~ SOAJ
100.0000 mg | SUBCUTANEOUS | Status: AC
  Administered 2023-08-13 – 2024-03-24 (×5): 100 mg via SUBCUTANEOUS

## 2023-06-15 MED ORDER — TREMFYA 100 MG/ML ~~LOC~~ SOAJ: 100.0000 mg | SUBCUTANEOUS | 5 refills | Status: DC

## 2023-06-15 MED ORDER — TRIAMCINOLONE ACETONIDE 0.1 % EX CREA: TOPICAL_CREAM | CUTANEOUS | 2 refills | Status: AC

## 2023-06-15 MED ORDER — GUSELKUMAB 100 MG/ML ~~LOC~~ SOSY
100.0000 mg | PREFILLED_SYRINGE | Freq: Once | SUBCUTANEOUS | Status: AC
  Administered 2023-06-15: 100 mg via SUBCUTANEOUS

## 2023-06-15 NOTE — Patient Instructions (Addendum)
 Continue Tremfya 100 mg injections every 8 weeks.   Start Triamcinolone cream BID until clear then use PRN.  Patient will call if Triamcinolone is not the topical he wanted refills of.   Reviewed risks of biologics including immunosuppression, infections, injection site reaction, and failure to improve condition. Goal is control of skin condition, not cure.  Some older biologics such as Humira and Enbrel may slightly increase risk of malignancy and may worsen congestive heart failure.  Taltz and Cosentyx may cause inflammatory bowel disease to flare. The use of biologics requires long term medication management, including periodic office visits and monitoring of blood work.     Topical steroids (such as triamcinolone, fluocinolone, fluocinonide, mometasone, clobetasol, halobetasol, betamethasone, hydrocortisone) can cause thinning and lightening of the skin if they are used for too long in the same area. Your physician has selected the right strength medicine for your problem and area affected on the body. Please use your medication only as directed by your physician to prevent side effects.      Due to recent changes in healthcare laws, you may see results of your pathology and/or laboratory studies on MyChart before the doctors have had a chance to review them. We understand that in some cases there may be results that are confusing or concerning to you. Please understand that not all results are received at the same time and often the doctors may need to interpret multiple results in order to provide you with the best plan of care or course of treatment. Therefore, we ask that you please give [place] 2 business days to thoroughly review all your results before contacting the office for clarification. Should we see a critical lab result, you will be contacted sooner.   If You Need Anything After Your Visit  If you have any questions or concerns for your doctor, please call our main line at  [phone] and press option 4 to reach your doctor's medical assistant. If no one answers, please leave a voicemail as directed and we will return your call as soon as possible. Messages left after 4 pm will be answered the following business day.   You may also send [place] a message via MyChart. We typically respond to MyChart messages within 1-2 business days.  For prescription refills, please ask your pharmacy to contact our office. Our fax number is [phone].  If you have an urgent issue when the clinic is closed that cannot wait until the next business day, you can page your doctor at the number below.    Please note that while we do our best to be available for urgent issues outside of office hours, we are not available 24/7.   If you have an urgent issue and are unable to reach [place], you may choose to seek medical care at your doctor's office, retail clinic, urgent care center, or emergency room.  If you have a medical emergency, please immediately call 911 or go to the emergency department.  Pager Numbers  - Dr. Gwen Pounds: [phone]  - Dr. Roseanne Reno: [phone]  - Dr. Katrinka Blazing: [phone]   In the event of inclement weather, please call our main line at [phone] for an update on the status of any delays or closures.  Dermatology Medication Tips: Please keep the boxes that topical medications come in in order to help keep track of the instructions about where and how to use these. Pharmacies typically print the medication instructions only on the boxes and not directly on the medication tubes.  If your medication is too expensive, please contact our office at [phone] option 4 or send [place] a message through MyChart.   We are unable to tell what your co-pay for medications will be in advance as this is different depending on your insurance coverage. However, we may be able to find a substitute medication at lower cost or fill out paperwork to get insurance to cover a needed  medication.   If a prior authorization is required to get your medication covered by your insurance company, please allow [place] 1-2 business days to complete this process.  Drug prices often vary depending on where the prescription is filled and some pharmacies may offer cheaper prices.  The website www.goodrx.com contains coupons for medications through different pharmacies. The prices here do not account for what the cost may be with help from insurance (it may be cheaper with your insurance), but the website can give you the price if you did not use any insurance.  - You can print the associated coupon and take it with your prescription to the pharmacy.  - You may also stop by our office during regular business hours and pick up a GoodRx coupon card.  - If you need your prescription sent electronically to a different pharmacy, notify our office through [facility] or by phone at [phone] option 4.     Si Usted Necesita Algo Despus de Su Visita  Tambin puede enviarnos un mensaje a travs de Clinical cytogeneticist. Por lo general respondemos a los mensajes de MyChart en el transcurso de 1 a 2 das hbiles.  Para renovar recetas, por favor pida a su farmacia que se ponga en contacto con nuestra oficina. Annie Sable de fax es Oral [phone].  Si tiene un asunto urgente cuando la clnica est cerrada y que no puede esperar hasta el siguiente da hbil, puede llamar/localizar a su doctor(a) al nmero que aparece a continuacin.   Por favor, tenga en cuenta que aunque hacemos todo lo posible para estar disponibles para asuntos urgentes fuera del horario de [place], no estamos disponibles las 24 horas del da, los 7 [address]  [address] de la [place].   Si tiene un problema urgente y no puede comunicarse con nosotros, puede optar por buscar atencin mdica  en el consultorio de su doctor(a), en una clnica privada, en un centro de atencin urgente o en una sala de emergencias.  Si tiene Engineer, drilling,  por favor llame inmediatamente al 911 o vaya a la sala de emergencias.  Nmeros de bper  - Dr. Gwen Pounds: [phone]  - Dra. Roseanne Reno: [phone]  - Dr. Katrinka Blazing: [phone]   En caso de inclemencias del tiempo, por favor llame a Lacy Duverney principal al [phone] para una actualizacin sobre el [place] de cualquier retraso o cierre.  Consejos para la medicacin en dermatologa: Por favor, guarde las cajas en las que vienen los medicamentos de uso tpico para ayudarle a seguir las instrucciones sobre dnde y cmo usarlos. Las farmacias generalmente imprimen las instrucciones del medicamento slo en las cajas y no directamente en los tubos del [place].   Si su medicamento es muy caro, por favor, pngase en contacto con Rolm Gala llamando al [phone] y presione la opcin 4 o envenos un mensaje a travs de Clinical cytogeneticist.   No podemos decirle cul ser su copago por los medicamentos por adelantado ya que esto es diferente dependiendo de la cobertura de su seguro. Sin embargo, es posible que podamos encontrar un medicamento sustituto a Audiological scientist  un formulario para que el seguro cubra el medicamento que se considera necesario.   Si se requiere una autorizacin previa para que su compaa de seguros [place] su medicamento, por favor permtanos de 1 a 2 das hbiles para completar [address]  Los precios de los medicamentos varan con frecuencia dependiendo del Environmental consultant de dnde se surte la receta y alguna farmacias pueden ofrecer precios ms baratos.  El sitio web www.goodrx.com tiene cupones para medicamentos de Health and safety inspector. Los precios aqu no tienen en cuenta lo que podra costar con la ayuda del seguro (puede ser ms barato con su seguro), pero el sitio web puede darle el precio si no utiliz Tourist information centre manager.  - Puede imprimir el cupn correspondiente y llevarlo con su receta a la farmacia.  - Tambin puede pasar por nuestra oficina durante el horario de atencin  regular y Education officer, museum una tarjeta de cupones de GoodRx.  - Si necesita que su receta se enve electrnicamente a una farmacia diferente, informe a nuestra oficina a travs de MyChart de  o por telfono llamando al [phone] y presione la opcin 4.

## 2023-06-15 NOTE — Progress Notes (Signed)
 Follow-Up Visit   Subjective  Ricky Mejia is a 53 y.o. male who presents for the following: Psoriasis. 6 month follow up. On Tremfya 100 mg injection since 09/11/2022. Tolerating well. Denies adverse reactions, side effects or injection site reactions. Affected areas include hands and feet. Patient states areas have improved.   The following portions of the chart were reviewed this encounter and updated as appropriate: medications, allergies, medical history  Review of Systems:  No other skin or systemic complaints except as noted in HPI or Assessment and Plan.  Objective  Well appearing patient in no apparent distress; mood and affect are within normal limits.  Areas Examined: Hands   Relevant exam findings are noted in the Assessment and Plan.      Assessment & Plan   PSORIASIS   Related Medications guselkumab (TREMFYA) pen 100 mg  triamcinolone cream (KENALOG) 0.1 % Apply BID until smooth then use PRN. Avoid applying to face, groin, and axilla. guselkumab (TREMFYA) 100 MG/ML pen Inject 1 mL (100 mg total) into the skin every 8 (eight) weeks. For maintenance. guselkumab (TREMFYA) prefilled syringe 100 mg  guselkumab (TREMFYA) pen 100 mg  LONG-TERM USE OF HIGH-RISK MEDICATION   Related Procedures QuantiFERON-TB Gold Plus Related Medications guselkumab (TREMFYA) 100 MG/ML pen Inject 1 mL (100 mg total) into the skin every 8 (eight) weeks. For maintenance. guselkumab (TREMFYA) prefilled syringe 100 mg  guselkumab (TREMFYA) pen 100 mg  COUNSELING AND COORDINATION OF CARE    PSORIASIS on systemic treatment with Tremfya Exam: no lesions per patient   Chronic and persistent condition with duration or expected duration over one year. Clear on Tremfya. At patient goal  Quant gold negative 07/22/22  Counseling and coordination of care for severe psoriasis on systemic treatment  Psoriasis - severe on systemic treatment.  Psoriasis is a chronic non-curable,  but treatable genetic/hereditary disease that may have other systemic features affecting other organ systems such as joints (Psoriatic Arthritis).  It is linked with heart disease, inflammatory bowel disease, non-alcoholic fatty liver disease, and depression. Significant skin psoriasis and/or psoriatic arthritis may have significant symptoms and affects activities of daily activity and often benefits from systemic treatments.  These systemic treatments have some potential side effects including immunosuppression and require pre-treatment laboratory screening and periodic laboratory monitoring and periodic in person evaluation and monitoring by the attending dermatologist physician (long term medication management).   Patient denies joint pain  Treatment Plan: Continue Tremfya 100 mg injections every 8 weeks. No side effects  Tremfya 100 mg syringe injected into right upper arm. Patient tolerated well.  NDC: 16109-604-54 Exp: 10/2023 Lot: [id number]  Start Triamcinolone cream BID until clear then use PRN.  Patient will call if Triamcinolone is not the topical he wanted refills of. Might be Zoryve 0.3% cream that was sent to [facility] pharmacy Repeat quantiferon gold  Reviewed risks of biologics including immunosuppression, infections, injection site reaction, and failure to improve condition. Goal is control of skin condition, not cure.  Some older biologics such as Humira and Enbrel may slightly increase risk of malignancy and may worsen congestive heart failure.  Taltz and Cosentyx may cause inflammatory bowel disease to flare. The use of biologics requires long term medication management, including periodic office visits and monitoring of blood work.   Long term medication management.  Patient is using long term (months to years) prescription medication  to control their dermatologic condition.  These medications require periodic monitoring to evaluate for efficacy and side effects and may  require  periodic laboratory monitoring.   Return in about 1 year (around 06/14/2024) for Psoriasis Follow Up, 2 months Tremfya injection on nurse schedule.  I, Lawson Radar, CMA, am acting as scribe for Elie Goody, MD.   Documentation: I have reviewed the above documentation for accuracy and completeness, and I agree with the above.  Elie Goody, MD

## 2023-06-20 LAB — QUANTIFERON-TB GOLD PLUS
QuantiFERON Mitogen Value: 5.67 [IU]/mL
QuantiFERON Nil Value: 0 [IU]/mL
QuantiFERON TB1 Ag Value: 0 [IU]/mL
QuantiFERON TB2 Ag Value: 0 [IU]/mL
QuantiFERON-TB Gold Plus: NEGATIVE

## 2023-06-22 ENCOUNTER — Telehealth: Payer: Self-pay

## 2023-06-22 NOTE — Telephone Encounter (Signed)
-----   Message from [place] sent at 06/22/2023  3:07 PM EDT ----- Please call to share quantiferon gold was negative meaning no TB detected

## 2023-06-22 NOTE — Telephone Encounter (Signed)
 LMOVM TB test negative. Continue Tremfya as directed.

## 2023-08-13 ENCOUNTER — Ambulatory Visit

## 2023-08-13 DIAGNOSIS — L400 Psoriasis vulgaris: Secondary | ICD-10-CM

## 2023-08-13 NOTE — Progress Notes (Signed)
 Patient here for Tremfya  injection for Psoriasis Vulgaris.    Tremfya  100mg /mL pen injected into left upper arm. Patient tolerated well.   LOT: [id number] EXP: 09/2024   Lisbeth Rides, RMA

## 2023-09-27 NOTE — Progress Notes (Signed)
 MRN : [id number]  Ricky Mejia is a 53 y.o. (December 03, 1970) male who presents with chief complaint of legs swell.  History of Present Illness:   The patient returns to the office for followup evaluation regarding leg swelling.  The swelling has persisted and the pain associated with swelling continues. There have not been any interval development of a ulcerations or wounds.   Since the previous visit the patient has been wearing graduated compression stockings and has noted little if any improvement in the lymphedema. The patient has been using compression routinely morning until night.   The patient also states elevation during the day and exercise is being done too.  No outpatient medications have been marked as taking for the 09/28/23 encounter (Appointment) with Jama, Cordella MATSU, MD.   Current Facility-Administered Medications for the 09/28/23 encounter (Appointment) with Jama, Cordella MATSU, MD  Medication   guselkumab  (TREMFYA ) pen 100 mg    Past Medical History:  Diagnosis Date   Abnormal LFTs    Allergic rhinitis    Diverticulitis    HUS (hemolytic uremic syndrome) (HCC)    Hyperplastic colon polyp    Hypertension    Obesity    Psoriasis    Serrated adenoma of colon    T.T.P. syndrome (HCC)     Past Surgical History:  Procedure Laterality Date   COLONOSCOPY     COLONOSCOPY WITH PROPOFOL  N/A 01/07/2018   Procedure: COLONOSCOPY WITH PROPOFOL ;  Surgeon: Unk Corinn Skiff, MD;  Location: [facility] ENDOSCOPY;  Service: Gastroenterology;  Laterality: N/A;   SIGMOIDOSCOPY     WISDOM TOOTH EXTRACTION      Social History Social History   Tobacco Use   Smoking status: Never   Smokeless tobacco: Former    Types: Chew    Quit date: 04/14/2022  Vaping Use   Vaping status: Never Used  Substance Use Topics   Alcohol use: No   Drug use: No    Family History Family History  Problem Relation Age of Onset   Lupus Mother    Lung cancer  Father    Diabetes Mellitus I Father    Cancer Father    Osteoarthritis Maternal Grandmother    Cancer Maternal Grandmother    Osteoarthritis Maternal Grandfather    Colon cancer Maternal Grandfather    Osteoarthritis Paternal Grandmother    Osteoarthritis Paternal Grandfather     Allergies  Allergen Reactions   Flonase [Fluticasone Propionate] Other (See Comments)    nosebleeds   Sulfa Antibiotics Rash     REVIEW OF SYSTEMS (Negative unless checked)  Constitutional: [] Weight loss  [] Fever  [] Chills Cardiac: [] Chest pain   [] Chest pressure   [] Palpitations   [] Shortness of breath when laying flat   [] Shortness of breath with exertion. Vascular:  [] Pain in legs with walking   [x] Pain in legs with standing  [] History of DVT   [] Phlebitis   [x] Swelling in legs   [] Varicose veins   [] Non-healing ulcers Pulmonary:   [] Uses home oxygen   [] Productive cough   [] Hemoptysis   [] Wheeze  [] COPD   [] Asthma Neurologic:  [] Dizziness   [] Seizures   [] History of stroke   [] History of TIA  [] Aphasia   [] Vissual changes   [] Weakness or numbness in arm   [] Weakness or numbness in leg Musculoskeletal:   [] Joint swelling   [] Joint pain   []   Low back pain Hematologic:  [] Easy bruising  [] Easy bleeding   [] Hypercoagulable state   [] Anemic Gastrointestinal:  [] Diarrhea   [] Vomiting  [] Gastroesophageal reflux/heartburn   [] Difficulty swallowing. Genitourinary:  [] Chronic kidney disease   [] Difficult urination  [] Frequent urination   [] Blood in urine Skin:  [] Rashes   [] Ulcers  Psychological:  [] History of anxiety   []  History of major depression.  Physical Examination  There were no vitals filed for this visit. There is no height or weight on file to calculate BMI. Gen: WD/WN, NAD Head: [place]/AT, No temporalis wasting.  Ear/Nose/Throat: Hearing grossly intact, nares w/o erythema or drainage, pinna without lesions Eyes: PER, EOMI, sclera nonicteric.  Neck: Supple, no gross masses.  No JVD.  Pulmonary:   Good air movement, no audible wheezing, no use of accessory muscles.  Cardiac: RRR, precordium not hyperdynamic. Vascular:  scattered varicosities present bilaterally.  Mild venous stasis changes to the legs bilaterally.  3-4+ soft pitting edema, CEAP [id number]  Vessel Right Left  Radial Palpable Palpable  Gastrointestinal: soft, non-distended. No guarding/no peritoneal signs.  Musculoskeletal: M/S 5/5 throughout.  No deformity.  Neurologic: CN 2-12 intact. Pain and light touch intact in extremities.  Symmetrical.  Speech is fluent. Motor exam as listed above. Psychiatric: Judgment intact, Mood & affect appropriate for pt's clinical situation. Dermatologic: Venous rashes no ulcers noted.  No changes consistent with cellulitis. Lymph : No lichenification or skin changes of chronic lymphedema.  CBC Lab Results  Component Value Date   WBC 7.4 10/14/2022   HGB 16.2 10/14/2022   HCT 47.1 10/14/2022   MCV 95.7 10/14/2022   PLT 206 10/14/2022    BMET    Component Value Date/Time   NA 140 10/14/2022 0758   NA 144 11/22/2018 0833   NA 140 10/01/2011 1931   K 4.2 10/14/2022 0758   K 3.9 10/01/2011 1931   CL 103 10/14/2022 0758   CL 106 10/01/2011 1931   CO2 29 10/14/2022 0758   CO2 24 10/01/2011 1931   GLUCOSE 116 (H) 10/14/2022 0758   GLUCOSE 99 10/01/2011 1931   BUN 13 10/14/2022 0758   BUN 11 11/22/2018 0833   BUN 12 10/01/2011 1931   CREATININE 0.97 10/14/2022 0758   CALCIUM 9.6 10/14/2022 0758   CALCIUM 9.4 10/01/2011 1931   GFRNONAA >60 09/13/2020 1039   GFRNONAA >60 10/01/2011 1931   GFRAA 116 11/22/2018 0833   GFRAA >60 10/01/2011 1931   CrCl cannot be calculated (Patient's most recent lab result is older than the maximum 21 days allowed.).  COAG Lab Results  Component Value Date   INR 0.85 02/16/2018    Radiology No results found.   Assessment/Plan 1. Lymphedema (Primary) Recommend:  No surgery or intervention at this point in time.    I have reviewed  my discussion with the patient regarding lymphedema and why it  causes symptoms.  Patient will continue wearing graduated compression on a daily basis. The patient should put the compression on first thing in the morning and removing them in the evening. The patient should not sleep in the compression.   In addition, behavioral modification throughout the day will be continued.  This will include frequent elevation (such as in a recliner), use of over the counter pain medications as needed and exercise such as walking.  The systemic causes for chronic edema such as liver, kidney and cardiac etiologies does not appear to have significant changed over the past year.    The patient will continue aggressive  use of the  lymph pump.  This will continue to improve the edema control and prevent sequela such as ulcers and infections.   The patient will follow-up with me on an annual basis.   2. Essential hypertension Continue antihypertensive medications as already ordered, these medications have been reviewed and there are no changes at this time.    Cordella Shawl, MD  09/27/2023 4:33 PM

## 2023-09-28 ENCOUNTER — Encounter (INDEPENDENT_AMBULATORY_CARE_PROVIDER_SITE_OTHER): Payer: Self-pay | Admitting: Vascular Surgery

## 2023-09-28 ENCOUNTER — Ambulatory Visit (INDEPENDENT_AMBULATORY_CARE_PROVIDER_SITE_OTHER): Payer: Managed Care, Other (non HMO) | Admitting: Vascular Surgery

## 2023-09-28 VITALS — BP 159/97 | HR 123 | Resp 18 | Ht 73.0 in | Wt 316.4 lb

## 2023-09-28 DIAGNOSIS — I10 Essential (primary) hypertension: Secondary | ICD-10-CM

## 2023-09-28 DIAGNOSIS — I890 Lymphedema, not elsewhere classified: Secondary | ICD-10-CM

## 2023-10-03 ENCOUNTER — Encounter (INDEPENDENT_AMBULATORY_CARE_PROVIDER_SITE_OTHER): Payer: Self-pay | Admitting: Vascular Surgery

## 2023-10-08 ENCOUNTER — Ambulatory Visit

## 2023-10-08 DIAGNOSIS — L400 Psoriasis vulgaris: Secondary | ICD-10-CM

## 2023-10-08 NOTE — Progress Notes (Signed)
 Patient here for Tremfya  injection for Psoriasis Vulgaris.    Tremfya  100mg /mL pen injected into right upper arm. Patient tolerated well.    LOT: [id number] EXP: 10/2024   Mikeyla Music V. Wilfred, CMA

## 2023-11-02 ENCOUNTER — Other Ambulatory Visit: Payer: Self-pay

## 2023-11-02 ENCOUNTER — Other Ambulatory Visit: Payer: Self-pay | Admitting: Family Medicine

## 2023-11-02 DIAGNOSIS — R600 Localized edema: Secondary | ICD-10-CM

## 2023-11-02 DIAGNOSIS — Z125 Encounter for screening for malignant neoplasm of prostate: Secondary | ICD-10-CM

## 2023-11-02 DIAGNOSIS — R7303 Prediabetes: Secondary | ICD-10-CM

## 2023-11-02 DIAGNOSIS — I10 Essential (primary) hypertension: Secondary | ICD-10-CM

## 2023-11-02 DIAGNOSIS — E781 Pure hyperglyceridemia: Secondary | ICD-10-CM

## 2023-11-02 DIAGNOSIS — Z0000 Encounter for general adult medical examination without abnormal findings: Secondary | ICD-10-CM

## 2023-11-03 ENCOUNTER — Other Ambulatory Visit: Payer: Self-pay

## 2023-11-03 ENCOUNTER — Ambulatory Visit: Payer: Self-pay | Admitting: Family Medicine

## 2023-11-03 DIAGNOSIS — I10 Essential (primary) hypertension: Secondary | ICD-10-CM

## 2023-11-03 DIAGNOSIS — R600 Localized edema: Secondary | ICD-10-CM

## 2023-11-03 LAB — CBC WITH DIFFERENTIAL/PLATELET
Absolute Lymphocytes: 1537 {cells}/uL (ref 850–3900)
Absolute Monocytes: 796 {cells}/uL (ref 200–950)
Basophils Absolute: 39 {cells}/uL (ref 0–200)
Basophils Relative: 0.5 %
Eosinophils Absolute: 133 {cells}/uL (ref 15–500)
Eosinophils Relative: 1.7 %
HCT: 47.8 % (ref 38.5–50.0)
Hemoglobin: 16.3 g/dL (ref 13.2–17.1)
MCH: 32.8 pg (ref 27.0–33.0)
MCHC: 34.1 g/dL (ref 32.0–36.0)
MCV: 96.2 fL (ref 80.0–100.0)
MPV: 11.1 fL (ref 7.5–12.5)
Monocytes Relative: 10.2 %
Neutro Abs: 5296 {cells}/uL (ref 1500–7800)
Neutrophils Relative %: 67.9 %
Platelets: 220 Thousand/uL (ref 140–400)
RBC: 4.97 Million/uL (ref 4.20–5.80)
RDW: 13.6 % (ref 11.0–15.0)
Total Lymphocyte: 19.7 %
WBC: 7.8 Thousand/uL (ref 3.8–10.8)

## 2023-11-03 LAB — LIPID PANEL
Cholesterol: 161 mg/dL (ref <200)
HDL: 43 mg/dL (ref >=40)
LDL Cholesterol (Calc): 88 mg/dL
Non-HDL Cholesterol (Calc): 118 mg/dL (ref <130)
Total CHOL/HDL Ratio: 3.7 (calc) (ref <5.0)
Triglycerides: 206 mg/dL — ABNORMAL HIGH (ref <150)

## 2023-11-03 LAB — COMPLETE METABOLIC PANEL WITHOUT GFR
AG Ratio: 1.6 (calc) (ref 1.0–2.5)
ALT: 74 U/L — ABNORMAL HIGH (ref 9–46)
AST: 50 U/L — ABNORMAL HIGH (ref 10–35)
Albumin: 4.2 g/dL (ref 3.6–5.1)
Alkaline phosphatase (APISO): 69 U/L (ref 35–144)
BUN: 13 mg/dL (ref 7–25)
CO2: 26 mmol/L (ref 20–32)
Calcium: 9.7 mg/dL (ref 8.6–10.3)
Chloride: 103 mmol/L (ref 98–110)
Creat: 0.84 mg/dL (ref 0.70–1.30)
Globulin: 2.7 g/dL (ref 1.9–3.7)
Glucose, Bld: 132 mg/dL — ABNORMAL HIGH (ref 65–99)
Potassium: 3.9 mmol/L (ref 3.5–5.3)
Sodium: 139 mmol/L (ref 135–146)
Total Bilirubin: 0.4 mg/dL (ref 0.2–1.2)
Total Protein: 6.9 g/dL (ref 6.1–8.1)

## 2023-11-03 LAB — PSA: PSA: 0.52 ng/mL (ref <=4.00)

## 2023-11-03 LAB — HEMOGLOBIN A1C
Hgb A1c MFr Bld: 6.3 % — ABNORMAL HIGH (ref <5.7)
Mean Plasma Glucose: 134 mg/dL
eAG (mmol/L): 7.4 mmol/L

## 2023-11-03 MED ORDER — HYDROCHLOROTHIAZIDE 25 MG PO TABS
25.0000 mg | ORAL_TABLET | Freq: Every day | ORAL | 11 refills | Status: AC
Start: 2023-11-03 — End: ?

## 2023-11-09 ENCOUNTER — Ambulatory Visit: Payer: Self-pay | Admitting: Family Medicine

## 2023-12-01 ENCOUNTER — Ambulatory Visit (INDEPENDENT_AMBULATORY_CARE_PROVIDER_SITE_OTHER): Admitting: Family Medicine

## 2023-12-01 ENCOUNTER — Encounter: Payer: Self-pay | Admitting: Family Medicine

## 2023-12-01 VITALS — BP 132/84 | HR 116 | Ht 73.0 in | Wt 321.0 lb

## 2023-12-01 DIAGNOSIS — J329 Chronic sinusitis, unspecified: Secondary | ICD-10-CM | POA: Insufficient documentation

## 2023-12-01 DIAGNOSIS — I10 Essential (primary) hypertension: Secondary | ICD-10-CM

## 2023-12-01 DIAGNOSIS — Z1211 Encounter for screening for malignant neoplasm of colon: Secondary | ICD-10-CM | POA: Diagnosis not present

## 2023-12-01 DIAGNOSIS — K644 Residual hemorrhoidal skin tags: Secondary | ICD-10-CM | POA: Diagnosis not present

## 2023-12-01 DIAGNOSIS — R7303 Prediabetes: Secondary | ICD-10-CM

## 2023-12-01 DIAGNOSIS — Z0000 Encounter for general adult medical examination without abnormal findings: Secondary | ICD-10-CM | POA: Diagnosis not present

## 2023-12-01 DIAGNOSIS — E781 Pure hyperglyceridemia: Secondary | ICD-10-CM

## 2023-12-01 NOTE — Patient Instructions (Addendum)
 Thank you for coming to the office today.  Recent Labs    05/05/23 1608 11/02/23 0836  HGBA1C 6.1* 6.3*   Keep improving on reducing carb starch sugar.  Recommend nasal spray such as Flonase can do OTC or I can order. They also have nasal decongestants.  Recommend Neurologist for nerve conduction study for the legs in future if interested to check nerve impingement.   FUTURE if interested Coronary Calcium Score Cardiac CT Scan. This is a screening test for patients aged 61-50+ with cardiovascular risk factors or who are healthy but would be interested in Cardiovascular Screening for heart disease. Even if there is a family history of heart disease, this imaging can be useful. Typically it can be done every 5+ years or at a different timeline we agree on  The scan will look at the chest and mainly focus on the heart and identify early signs of calcium build up or blockages within the heart arteries. It is not 100% accurate for identifying blockages or heart disease, but it is useful to help us  predict who may have some early changes or be at risk in the future for a heart attack or cardiovascular problem.  The results are reviewed by a Cardiologist and they will document the results. It should become available on MyChart. Typically the results are divided into percentiles based on other patients of the same demographic and age. So it will compare your risk to others similar to you. If you have a higher score >99 or higher percentile >75%tile, it is recommended to consider Statin cholesterol therapy and or referral to Cardiologist. I will try to help explain your results and if we have questions we can contact the Cardiologist.  You will be contacted for scheduling. Usually it is done at any imaging facility through [facility], [facility] or [facility].  The cost is $99 flat fee total and it does not go through insurance, so no authorization is  required.   Please schedule a Follow-up Appointment to: Return in about 6 months (around 06/02/2024) for 6 month PreDM A1c, Joint Pain vs Nerve Pain / Sinusitis.  If you have any other questions or concerns, please feel free to call the office or send a message through MyChart. You may also schedule an earlier appointment if necessary.  Additionally, you may be receiving a survey about your experience at our office within a few days to 1 week by e-mail or mail. We value your feedback.  Marsa Officer, DO [facility], [place]

## 2023-12-01 NOTE — Progress Notes (Signed)
 Subjective:    Patient ID: Ricky Mejia, male    DOB: [birth year], 53 y.o.   MRN: [id number]  Ricky Mejia is a 53 y.o. male presenting on 12/01/2023 for Annual Exam   HPI  Discussed the use of AI scribe software for clinical note transcription with the patient, who gave verbal consent to proceed.  History of Present Illness   Ricky Mejia is a 53 year old male who presents for an annual physical exam and evaluation of rectal bleeding.  Hemorrhoids ext / int Rectal bleeding - Fresh blood on toilet paper with bowel movements - No blood observed in the toilet bowl - History of hemorrhoids - Last colonoscopy in 2019 revealed hemorrhoids - Concern about colon cancer due to a colleague's recent diagnosis. Request repeat Colonoscopy  Joint Pain, multiple locations Overuse with work and physical job - Daily aching pain, possibly related to arthritis - Soreness and difficulty with physical activities, such as getting out of the floor - Currently taking Arnica twice daily for pain relief - Taking a different kind of vitamin - Efforts to improve health include reducing red meat consumption and increasing water intake  Prediabetes - History of prediabetes - Recent hemoglobin A1c of 6.3 - Lifestyle modifications include eliminating sodas and tobacco - Remains in the prediabetes range despite lifestyle changes  Nasal congestion and sinus issues - Daily nasal congestion - Uses Zyrtec daily for symptom control - Avoids nasal sprays due to discomfort - Crooked nasal septum, believed to contribute to sinus symptoms  Psoriasis - History of psoriasis, currently treated with Tremfya  Dermatology  Bilateral Thigh Pain Numbness Chronic history of possible meralgia paraesthetica  - Numbness in leg, suspected to be related to a pinched nerve - Symptoms have persisted for at least one year without improvement despite trying different medications       Health Maintenance: PSA  0.52 negative     12/01/2023    4:14 PM 05/05/2023    3:40 PM 10/21/2022    3:38 PM  Depression screen PHQ 2/9  Decreased Interest 3  0  Down, Depressed, Hopeless 0 0 0  PHQ - 2 Score 3 0 0  Altered sleeping 2  0  Tired, decreased energy 3  0  Change in appetite 1  0  Feeling bad or failure about yourself  0  0  Trouble concentrating 0  0  Moving slowly or fidgety/restless 0  0  Suicidal thoughts 0  0  PHQ-9 Score 9  0  Difficult doing work/chores Not difficult at all  Not difficult at all       12/01/2023    4:14 PM 05/05/2023    3:40 PM 10/21/2022    3:38 PM 07/31/2022   11:26 AM  GAD 7 : Generalized Anxiety Score  Nervous, Anxious, on Edge 2 0 0 0  Control/stop worrying 1 0 0 0  Worry too much - different things 2 0 0 0  Trouble relaxing 2 0 0 0  Restless 2 0 0 0  Easily annoyed or irritable 2 0 0 0  Afraid - awful might happen 0 0 0 0  Total GAD 7 Score 11 0 0 0  Anxiety Difficulty Not difficult at all Not difficult at all Not difficult at all      Past Medical History:  Diagnosis Date   Abnormal LFTs    Allergic rhinitis    Diverticulitis    HUS (hemolytic uremic syndrome) (HCC)    Hyperplastic colon  polyp    Hypertension    Obesity    Psoriasis    Serrated adenoma of colon    T.T.P. syndrome (HCC)    Past Surgical History:  Procedure Laterality Date   COLONOSCOPY     COLONOSCOPY WITH PROPOFOL  N/A 01/07/2018   Procedure: COLONOSCOPY WITH PROPOFOL ;  Surgeon: Unk Corinn Skiff, MD;  Location: [facility] ENDOSCOPY;  Service: Gastroenterology;  Laterality: N/A;   SIGMOIDOSCOPY     WISDOM TOOTH EXTRACTION     Social History   Socioeconomic History   Marital status: Divorced    Spouse name: Not on file   Number of children: Not on file   Years of education: Not on file   Highest education level: Not on file  Occupational History   Not on file  Tobacco Use   Smoking status: Never   Smokeless tobacco: Former    Types: Chew    Quit date: 04/14/2022  Vaping Use    Vaping status: Never Used  Substance and Sexual Activity   Alcohol use: No   Drug use: No   Sexual activity: Not on file  Other Topics Concern   Not on file  Social History Narrative   Not on file   Social Drivers of Health   Financial Resource Strain: Not on file  Food Insecurity: Not on file  Transportation Needs: Not on file  Physical Activity: Not on file  Stress: Not on file  Social Connections: Not on file  Intimate Partner Violence: Not on file   Family History  Problem Relation Age of Onset   Lupus Mother    Lung cancer Father    Diabetes Mellitus I Father    Cancer Father    Osteoarthritis Maternal Grandmother    Cancer Maternal Grandmother    Osteoarthritis Maternal Grandfather    Colon cancer Maternal Grandfather    Osteoarthritis Paternal Grandmother    Osteoarthritis Paternal Grandfather    Current Outpatient Medications on File Prior to Visit  Medication Sig   cetirizine (ZYRTEC) 10 MG tablet Take 10 mg by mouth daily.   guselkumab  (TREMFYA ) 100 MG/ML pen Inject 1 mL (100 mg total) into the skin every 8 (eight) weeks. For maintenance.   Guselkumab  (TREMFYA ) 100 MG/ML SOPN Inject 100 mg into the skin as directed. At week 0 & 4.   hydrochlorothiazide  (HYDRODIURIL ) 25 MG tablet Take 1 tablet (25 mg total) by mouth daily.   ketoconazole  (NIZORAL ) 2 % shampoo Use as body wash 2 - 3 times weekly. Apply topically to affected areas let sit for a few minutes before rinsing.   Multiple Vitamin (MULTIVITAMIN) tablet Take 1 tablet by mouth daily.   triamcinolone  cream (KENALOG ) 0.1 % Apply BID until smooth then use PRN. Avoid applying to face, groin, and axilla.   Current Facility-Administered Medications on File Prior to Visit  Medication   guselkumab  (TREMFYA ) pen 100 mg    Review of Systems  Constitutional:  Negative for activity change, appetite change, chills, diaphoresis, fatigue and fever.  HENT:  Positive for congestion. Negative for hearing loss.    Eyes:  Negative for visual disturbance.  Respiratory:  Negative for cough, chest tightness, shortness of breath and wheezing.   Cardiovascular:  Negative for chest pain, palpitations and leg swelling.  Gastrointestinal:  Positive for rectal pain. Negative for abdominal pain, constipation, diarrhea, nausea and vomiting.  Genitourinary:  Negative for dysuria, frequency and hematuria.  Musculoskeletal:  Positive for arthralgias. Negative for neck pain.  Skin:  Negative for rash.  Neurological:  Negative for dizziness, weakness, light-headedness, numbness and headaches.  Hematological:  Negative for adenopathy.  Psychiatric/Behavioral:  Negative for behavioral problems, dysphoric mood and sleep disturbance.    Per HPI unless specifically indicated above     Objective:    BP 132/84 (BP Location: Left Arm, Patient Position: Sitting, Cuff Size: Large)   Pulse (!) 116   Ht 6' 1 (1.854 m)   Wt (!) 321 lb (145.6 kg)   SpO2 94%   BMI 42.35 kg/m   Wt Readings from Last 3 Encounters:  12/01/23 (!) 321 lb (145.6 kg)  09/28/23 (!) 316 lb 6.4 oz (143.5 kg)  05/05/23 (!) 323 lb (146.5 kg)    Physical Exam Vitals and nursing note reviewed.  Constitutional:      General: He is not in acute distress.    Appearance: He is well-developed. He is obese. He is not diaphoretic.     Comments: Well-appearing, comfortable, cooperative  HENT:     Head: Normocephalic and atraumatic.  Eyes:     General:        Right eye: No discharge.        Left eye: No discharge.     Conjunctiva/sclera: Conjunctivae normal.     Pupils: Pupils are equal, round, and reactive to light.  Neck:     Thyroid: No thyromegaly.  Cardiovascular:     Rate and Rhythm: Normal rate and regular rhythm.     Pulses: Normal pulses.     Heart sounds: Normal heart sounds. No murmur heard. Pulmonary:     Effort: Pulmonary effort is normal. No respiratory distress.     Breath sounds: Normal breath sounds. No wheezing or rales.   Abdominal:     General: Bowel sounds are normal. There is no distension.     Palpations: Abdomen is soft. There is no mass.     Tenderness: There is no abdominal tenderness.  Musculoskeletal:        General: No tenderness. Normal range of motion.     Cervical back: Normal range of motion and neck supple.     Right lower leg: No edema.     Left lower leg: No edema.     Comments: Upper / Lower Extremities: - Normal muscle tone, strength bilateral upper extremities 5/5, lower extremities 5/5  Lymphadenopathy:     Cervical: No cervical adenopathy.  Skin:    General: Skin is warm and dry.     Findings: No erythema or rash.  Neurological:     Mental Status: He is alert and oriented to person, place, and time.     Comments: Distal sensation intact to light touch all extremities  Psychiatric:        Mood and Affect: Mood normal.        Behavior: Behavior normal.        Thought Content: Thought content normal.     Comments: Well groomed, good eye contact, normal speech and thoughts     Results for orders placed or performed in visit on 11/02/23  PSA   Collection Time: 11/02/23  8:36 AM  Result Value Ref Range   PSA 0.52 < OR = 4.00 ng/mL  CBC with Differential/Platelet   Collection Time: 11/02/23  8:36 AM  Result Value Ref Range   WBC 7.8 3.8 - 10.8 Thousand/uL   RBC 4.97 4.20 - 5.80 Million/uL   Hemoglobin 16.3 13.2 - 17.1 g/dL   HCT 52.1 61.4 - 49.9 %   MCV 96.2 80.0 -  100.0 fL   MCH 32.8 27.0 - 33.0 pg   MCHC 34.1 32.0 - 36.0 g/dL   RDW 86.3 88.9 - 84.9 %   Platelets 220 140 - 400 Thousand/uL   MPV 11.1 7.5 - 12.5 fL   Neutro Abs 5,296 1,500 - 7,800 cells/uL   Absolute Lymphocytes 1,537 850 - 3,900 cells/uL   Absolute Monocytes 796 200 - 950 cells/uL   Eosinophils Absolute 133 15 - 500 cells/uL   Basophils Absolute 39 0 - 200 cells/uL   Neutrophils Relative % 67.9 %   Total Lymphocyte 19.7 %   Monocytes Relative 10.2 %   Eosinophils Relative 1.7 %   Basophils  Relative 0.5 %  COMPLETE METABOLIC PANEL WITH GFR   Collection Time: 11/02/23  8:36 AM  Result Value Ref Range   Glucose, Bld 132 (H) 65 - 99 mg/dL   BUN 13 7 - 25 mg/dL   Creat 9.15 9.29 - 8.69 mg/dL   BUN/Creatinine Ratio SEE NOTE: 6 - 22 (calc)   Sodium 139 135 - 146 mmol/L   Potassium 3.9 3.5 - 5.3 mmol/L   Chloride 103 98 - 110 mmol/L   CO2 26 20 - 32 mmol/L   Calcium 9.7 8.6 - 10.3 mg/dL   Total Protein 6.9 6.1 - 8.1 g/dL   Albumin 4.2 3.6 - 5.1 g/dL   Globulin 2.7 1.9 - 3.7 g/dL (calc)   AG Ratio 1.6 1.0 - 2.5 (calc)   Total Bilirubin 0.4 0.2 - 1.2 mg/dL   Alkaline phosphatase (APISO) 69 35 - 144 U/L   AST 50 (H) 10 - 35 U/L   ALT 74 (H) 9 - 46 U/L  Hemoglobin A1c   Collection Time: 11/02/23  8:36 AM  Result Value Ref Range   Hgb A1c MFr Bld 6.3 (H) <5.7 %   Mean Plasma Glucose 134 mg/dL   eAG (mmol/L) 7.4 mmol/L  Lipid panel   Collection Time: 11/02/23  8:36 AM  Result Value Ref Range   Cholesterol 161 <200 mg/dL   HDL 43 > OR = 40 mg/dL   Triglycerides 793 (H) <150 mg/dL   LDL Cholesterol (Calc) 88 mg/dL (calc)   Total CHOL/HDL Ratio 3.7 <5.0 (calc)   Non-HDL Cholesterol (Calc) 118 <130 mg/dL (calc)      Assessment & Plan:   Problem List Items Addressed This Visit     Chronic rhinosinusitis   Essential hypertension   Hereditary hemochromatosis (HCC)   Morbid obesity (HCC)   Pre-diabetes   Other Visit Diagnoses       Annual physical exam    -  Primary     Screening for colon cancer       Relevant Orders   Ambulatory referral to Gastroenterology     External hemorrhoid, bleeding         Hypertriglyceridemia            Updated Health Maintenance information Reviewed recent lab results with patient Encouraged improvement to lifestyle with diet and exercise Goal of weight loss  Rectal Bleeding BRBPR / Rectal Pain episodic Rectal bleeding likely from hemorrhoids, ext and int variety, supported by fresh blood on toilet paper and previous colonoscopy  findings documented hemorrhoids. No melena or other concerns. Due for colonoscopy last done 5 yr ago - Schedule colonoscopy for routine screening.  Chronic lower extremity pain and numbness, possible neuropathy Chronic pain and numbness in lower extremities, possibly neuropathy. No improvement with prior treatments. Previous diagnosis of Meralgia paresthetica given localized to anterior  lateral thighs, has bilateral problem. Failed conservative therapy and medication management without resolution Offer Refer to neurologist for nerve conduction study and further management, he declines today due to other obligations but will consider this next step Possible X-rays Hips / Knees if indicated  Chronic joint pain, possible osteoarthritis Chronic joint pain, possibly osteoarthritis, worsened by activity. No specific joint identified. - Consider future referral to specialist if symptoms persist or worsen.  Prediabetes A1c at 6.3 indicates prediabetes. Lifestyle changes made, including reduced soda and red meat intake. - Continue lifestyle modifications to reduce sugar intake. - Monitor blood sugar levels regularly.  Hyperlipidemia Cholesterol levels improved with dietary changes. Total cholesterol decreased from 190 to 160, LDL from 110 to 88. - Continue current dietary modifications. Note he is not on statin therapy. Offer Coronary CT Screening. He defers today, consider in future.  Psoriasis Followed by Dermatology Psoriasis managed with Tremfya .  Chronic nasal congestion due to deviated septum and allergic rhinitis Chronic nasal congestion likely due to deviated septum and allergic rhinitis. Difficulty with nasal sprays. - Recommend over-the-counter allergy medications and decongestants as needed.  Consider ENT consult       Orders Placed This Encounter  Procedures   Ambulatory referral to Gastroenterology    Referral Priority:   Routine    Referral Type:   Consultation     Referral Reason:   Specialty Services Required    Number of Visits Requested:   1    No orders of the defined types were placed in this encounter.    Follow up plan: Return in about 6 months (around 06/02/2024) for 6 month PreDM A1c, Joint Pain vs Nerve Pain / Sinusitis.  Marsa Officer, DO [facility] 12/01/2023, 3:32 PM

## 2023-12-03 ENCOUNTER — Ambulatory Visit

## 2023-12-03 DIAGNOSIS — Z79899 Other long term (current) drug therapy: Secondary | ICD-10-CM | POA: Diagnosis not present

## 2023-12-03 DIAGNOSIS — L409 Psoriasis, unspecified: Secondary | ICD-10-CM | POA: Diagnosis not present

## 2023-12-03 NOTE — Progress Notes (Signed)
 Patient here for Tremfya  injection for Psoriasis Vulgaris.    Tremfya  100mg /mL pen injected into left upper arm. Patient tolerated well.     LOT: [id number] EXP: 12/2024   Alan Pizza, RMA

## 2023-12-18 ENCOUNTER — Ambulatory Visit
Admission: RE | Admit: 2023-12-18 | Discharge: 2023-12-18 | Disposition: A | Discharge status: Home / Self Care | Payer: Worker's Compensation | Source: Ambulatory Visit | Attending: Physician Assistant | Admitting: Physician Assistant

## 2023-12-18 ENCOUNTER — Other Ambulatory Visit: Payer: Self-pay | Admitting: Physician Assistant

## 2023-12-18 DIAGNOSIS — S99811A Other specified injuries of right ankle, initial encounter: Secondary | ICD-10-CM | POA: Diagnosis present

## 2023-12-18 DIAGNOSIS — Y990 Civilian activity done for income or pay: Secondary | ICD-10-CM

## 2023-12-18 DIAGNOSIS — W010XXA Fall on same level from slipping, tripping and stumbling without subsequent striking against object, initial encounter: Secondary | ICD-10-CM | POA: Insufficient documentation

## 2023-12-18 DIAGNOSIS — S99821A Other specified injuries of right foot, initial encounter: Secondary | ICD-10-CM | POA: Insufficient documentation

## 2024-01-28 ENCOUNTER — Ambulatory Visit

## 2024-01-28 ENCOUNTER — Other Ambulatory Visit: Payer: Self-pay

## 2024-01-28 DIAGNOSIS — L409 Psoriasis, unspecified: Secondary | ICD-10-CM | POA: Diagnosis not present

## 2024-01-28 MED ORDER — TREMFYA 100 MG/ML ~~LOC~~ SOSY: 100.0000 mg | PREFILLED_SYRINGE | SUBCUTANEOUS | 2 refills | Status: DC

## 2024-01-28 NOTE — Progress Notes (Signed)
 Patient here for Tremfya  injection for Psoriasis Vulgaris.    Tremfya  100mg /mL pen injected into right upper arm. Patient tolerated well.     LOT: [id number] EXP: 12/2024        Lonell Drones, RMA

## 2024-03-03 ENCOUNTER — Other Ambulatory Visit: Payer: Self-pay

## 2024-03-03 MED ORDER — TREMFYA 100 MG/ML ~~LOC~~ SOSY: 100.0000 mg | PREFILLED_SYRINGE | SUBCUTANEOUS | 2 refills | Status: AC

## 2024-03-03 NOTE — Progress Notes (Signed)
 Transfer to [employer] per insurance. aw

## 2024-03-24 ENCOUNTER — Ambulatory Visit

## 2024-03-24 NOTE — Progress Notes (Unsigned)
 Patient here for Tremfya  injection for Psoriasis Vulgaris.    Tremfya  100mg /mL pen injected into left upper arm. Patient tolerated well.     LOT: [id number] EXP: 05/2025          Alan Pizza, RMA

## 2024-05-19 ENCOUNTER — Ambulatory Visit: Payer: Self-pay

## 2024-05-19 DIAGNOSIS — L409 Psoriasis, unspecified: Secondary | ICD-10-CM

## 2024-05-19 DIAGNOSIS — L400 Psoriasis vulgaris: Secondary | ICD-10-CM

## 2024-05-19 MED ORDER — GUSELKUMAB 100 MG/ML ~~LOC~~ SOSY
100.0000 mg | PREFILLED_SYRINGE | Freq: Once | SUBCUTANEOUS | Status: AC
  Administered 2024-05-19: 100 mg via SUBCUTANEOUS

## 2024-05-19 NOTE — Progress Notes (Cosign Needed)
 Patient here for Tremfya  injection for Psoriasis Vulgaris.    Tremfya  100mg /mL syringe injected into right upper arm. Patient tolerated well.   LOT: [id number] EXP: 02/2025          Alan Pizza, RMA

## 2024-06-06 ENCOUNTER — Ambulatory Visit: Admitting: Family Medicine

## 2024-06-14 ENCOUNTER — Ambulatory Visit: Admitting: Dermatology

## 2024-07-14 ENCOUNTER — Ambulatory Visit: Admitting: Dermatology

## 2024-09-26 ENCOUNTER — Ambulatory Visit (INDEPENDENT_AMBULATORY_CARE_PROVIDER_SITE_OTHER): Admitting: Vascular Surgery
